# Patient Record
Sex: Male | Born: 1960 | Race: White | Hispanic: No | Marital: Single | Smoking: Never smoker
Health system: Southern US, Community
[De-identification: ages and names within clinical notes are randomized; demographics above are authoritative.]

## PROBLEM LIST (undated history)

## (undated) DIAGNOSIS — K219 Gastro-esophageal reflux disease without esophagitis: Secondary | ICD-10-CM

## (undated) DIAGNOSIS — M1990 Unspecified osteoarthritis, unspecified site: Secondary | ICD-10-CM

## (undated) HISTORY — DX: Gastro-esophageal reflux disease without esophagitis: K21.9

## (undated) HISTORY — PX: WISDOM TOOTH EXTRACTION: SHX21

## (undated) HISTORY — DX: Unspecified osteoarthritis, unspecified site: M19.90

---

## 2006-09-25 ENCOUNTER — Encounter: Payer: Self-pay | Admitting: Family Medicine

## 2009-03-24 ENCOUNTER — Encounter: Payer: Self-pay | Admitting: Family Medicine

## 2009-04-25 ENCOUNTER — Ambulatory Visit: Payer: Self-pay | Admitting: Family Medicine

## 2009-04-25 DIAGNOSIS — M109 Gout, unspecified: Secondary | ICD-10-CM | POA: Insufficient documentation

## 2009-05-26 ENCOUNTER — Telehealth: Payer: Self-pay | Admitting: Family Medicine

## 2009-11-27 ENCOUNTER — Telehealth: Payer: Self-pay | Admitting: Family Medicine

## 2009-12-15 ENCOUNTER — Ambulatory Visit: Payer: Self-pay | Admitting: Family Medicine

## 2009-12-15 LAB — CONVERTED CEMR LAB
BUN: 12 mg/dL (ref 6–23)
Basophils Relative: 0.6 % (ref 0.0–3.0)
Chloride: 106 meq/L (ref 96–112)
Cholesterol: 131 mg/dL (ref 0–200)
Eosinophils Relative: 3.6 % (ref 0.0–5.0)
GFR calc non Af Amer: 93.6 mL/min (ref >60)
HCT: 40.7 % (ref 39.0–52.0)
LDL Cholesterol: 75 mg/dL (ref 0–99)
Lymphs Abs: 1.8 10*3/uL (ref 0.7–4.0)
Monocytes Relative: 7.5 % (ref 3.0–12.0)
Platelets: 227 10*3/uL (ref 150.0–400.0)
Potassium: 4.2 meq/L (ref 3.5–5.1)
RBC: 4.75 M/uL (ref 4.22–5.81)
Sodium: 141 meq/L (ref 135–145)
Total CHOL/HDL Ratio: 4
Uric Acid, Serum: 7.4 mg/dL (ref 4.0–7.8)
VLDL: 22 mg/dL (ref 0.0–40.0)
WBC: 7 10*3/uL (ref 4.5–10.5)

## 2009-12-25 ENCOUNTER — Ambulatory Visit: Payer: Self-pay | Admitting: Family Medicine

## 2010-05-03 ENCOUNTER — Telehealth: Payer: Self-pay | Admitting: Family Medicine

## 2010-05-08 NOTE — Assessment & Plan Note (Signed)
Summary: roa//ccm   Vital Signs:  Patient profile:   50 year old male Weight:      176 pounds BMI:     24.99 O2 Sat:      97 % Temp:     96.6 degrees F oral Pulse rate:   75 / minute Pulse rhythm:   regular Resp:     16 per minute BP sitting:   120 / 82  Vitals Entered By: Lynann Beaver CMA (December 25, 2009 10:07 AM) CC: rov/gout flare in foot Is Patient Diabetic? No Pain Assessment Patient in pain? yes        History of Present Illness: Here for gout follow up.  On Allopurinol until about 2 weeks ago.  Uric acid last year 14.9 and down to  7.4 with recent labs.  Has had about 3 episodes of gout since starting allopurinol. Current flare involving L MTP joint.  Indomethacin generally works well for acute flares.   Current Medications (verified): 1)  Allopurinol 100 Mg Tabs (Allopurinol) .... One By Mouth Once Daily For 2 Weeks Then Two By Mouth Once Daily For 2 Weeks Then 3 By Mouth Once Daily  Allergies (verified): No Known Drug Allergies  Past History:  Past Medical History: Last updated: 04/25/2009 Arthritis GOUT  Family History: Last updated: 04/25/2009 Family History of Alcoholism/Addiction, grandfather Family History of Arthritis, mother Family History High cholesterol, parents Family History Hypertension, parents Heart disease, both parents  Social History: Last updated: 04/25/2009 Occupation:  [employer] [employer] Never Smoked Alcohol use-no Regular exercise-yes Married 2 children  Risk Factors: Exercise: yes (04/18/2009)  Risk Factors: Smoking Status: never (04/18/2009) PMH-FH-SH reviewed for relevance  Physical Exam  General:  Well-developed,well-nourished,in no acute distress; alert,appropriate and cooperative throughout examination Lungs:  Normal respiratory effort, chest expands symmetrically. Lungs are clear to auscultation, no crackles or wheezes. Heart:  Normal rate and regular rhythm. S1 and S2 normal without gallop, murmur,  click, rub or other extra sounds. Extremities:  L foot swelling and mild erythema MTP joint.   Impression & Recommendations:  Problem # 1:  GOUT, UNSPECIFIED (ICD-274.9) refilled indomethacin.  After acute flare subsides get back on Allopurinol and titrate to 400 mg once daily with goal uric acid < 6. repeat uric acid in 6 months after back on allopurinol.  Discussed dietary factors.  pt requests PSA with next labs. The following medications were removed from the medication list:    Indomethacin 50 Mg Caps (Indomethacin) .Marland Kitchen... 1 q8h as needed His updated medication list for this problem includes:    Allopurinol 100 Mg Tabs (Allopurinol) ..... Use as directed    Indomethacin 50 Mg Caps (Indomethacin) ..... One by mouth q 8 hours as needed  Complete Medication List: 1)  Allopurinol 100 Mg Tabs (Allopurinol) .... Use as directed 2)  Indomethacin 50 Mg Caps (Indomethacin) .... One by mouth q 8 hours as needed 3)  Oxycodone-acetaminophen 5-325 Mg Tabs (Oxycodone-acetaminophen) .Marland Kitchen.. 1-2 by mouth q 4-6 hours as needed pain  Patient Instructions: 1)  Please schedule a follow-up appointment in 6 months .  Prescriptions: OXYCODONE-ACETAMINOPHEN 5-325 MG TABS (OXYCODONE-ACETAMINOPHEN) 1-2 by mouth q 4-6 hours as needed pain  #30 x 0   Entered and Authorized by:   Evelena Peat MD   Signed by:   Evelena Peat MD on 12/25/2009   Method used:   Print then Give to Patient   RxID:   [id number] ALLOPURINOL 100 MG TABS (ALLOPURINOL) Use as directed  #120 x 11  Entered and Authorized by:   Evelena Peat MD   Signed by:   Evelena Peat MD on 12/25/2009   Method used:   Electronically to        [facility]. [phone]* (retail)       [phone] [address].       [place], [place]  [address]       Ph: [phone]       Fax: [phone]   RxID:   [id number] INDOMETHACIN 50 MG CAPS (INDOMETHACIN) one by mouth q 8 hours as needed  #30 x 3   Entered and  Authorized by:   Evelena Peat MD   Signed by:   Evelena Peat MD on 12/25/2009   Method used:   Electronically to        [facility]. [phone]* (retail)       [phone] [address].       [place], [place]  [address]       Ph: [phone]       Fax: [phone]   RxID:   [id number]

## 2010-05-08 NOTE — Progress Notes (Signed)
Summary: Rx  Phone Note Call from Patient   Caller: Patient Call For: Evelena Peat MD Summary of Call: asking for indocin for joint pain. He goes by [facility]. [facility]. Initial call taken by: Raechel Ache, RN,  May 26, 2009 2:16 PM  Follow-up for Phone Call        OK to refill Indocin 50 mg by mouth q 8 hours as needed #30 with 1 refill Follow-up by: Evelena Peat MD,  May 26, 2009 2:48 PM    New/Updated Medications: INDOMETHACIN 50 MG CAPS (INDOMETHACIN) 1 q8h as needed Prescriptions: INDOMETHACIN 50 MG CAPS (INDOMETHACIN) 1 q8h as needed  #30 x 1   Entered by:   Raechel Ache, RN   Authorized by:   Evelena Peat MD   Signed by:   Raechel Ache, RN on 05/26/2009   Method used:   Electronically to        [facility]. [phone]* (retail)       [phone]       [place], [place]  [address]       Ph: [phone]       Fax: [phone]   RxID:   [id number]

## 2010-05-08 NOTE — Assessment & Plan Note (Signed)
Summary: PT NEW TO LBF/TO ESTABLISH/PT REQ CPX COMING IN FASTING/CJR/P...   Vital Signs:  Patient profile:   50 year old male Height:      70.50 inches Weight:      173 pounds BMI:     24.56 Temp:     97.9 degrees F oral Pulse rate:   76 / minute Pulse rhythm:   regular Resp:     12 per minute BP sitting:   140 / 90  (left arm) Cuff size:   regular  Vitals Entered By: Sid Falcon LPN (April 25, 2009 10:50 AM) CC: New to establish, requesting CPX   History of Present Illness: Patient seen for establishing care. Also requesting physical. Brings lab work from urgent care done recently. Significant for uric acid 14.9 and triglycerides of 345 with LDL of 25 and HDL 42 total cholesterol 136. Patient had several labs for inflammatory marker such as ANA and RA latex which were negative.  He has history of arthralgias involving multiple joints mostly feet, ankles, knees, wrists, and hands. Asymmetric involvement. Prior history of inflammatory changes such as warmth and erythema. Presumptive diagnosis of gout. Responds well to Indocin. Does drink some excessive alcohol. Otherwise no dietary correlations. Symptoms have been progressive over recent years.  No tophaceous changes.  Patient has no other chronic medical problems. Family history reviewed signif for father's coronary disease age 83. Father also type 2 diabetes.  Social history:  he is married. 2 children. Alcohol sometimes over one and one-half cases per week. No cigarette use. Works for [employer].  Awaiting old records.  Thinks last tetanus < 10 years ago.  Past History:  Family History: Last updated: 04/25/2009 Family History of Alcoholism/Addiction, grandfather Family History of Arthritis, mother Family History High cholesterol, parents Family History Hypertension, parents Heart disease, both parents  Social History: Last updated: 04/25/2009 Occupation:  [employer] [employer] Never Smoked Alcohol use-no Regular  exercise-yes Married 2 children  Risk Factors: Exercise: yes (04/18/2009)  Risk Factors: Smoking Status: never (04/18/2009)  Past Medical History: Arthritis GOUT  Family History: Family History of Alcoholism/Addiction, grandfather Family History of Arthritis, mother Family History High cholesterol, parents Family History Hypertension, parents Heart disease, both parents  Social History: Occupation:  [employer] Never Smoked Alcohol use-no Regular exercise-yes Married 2 children  Review of Systems  The patient denies anorexia, fever, weight loss, weight gain, vision loss, decreased hearing, hoarseness, chest pain, syncope, dyspnea on exertion, peripheral edema, prolonged cough, headaches, hemoptysis, abdominal pain, melena, hematochezia, severe indigestion/heartburn, hematuria, incontinence, suspicious skin lesions, depression, enlarged lymph nodes, and testicular masses.    Physical Exam  General:  Well-developed,well-nourished,in no acute distress; alert,appropriate and cooperative throughout examination Head:  Normocephalic and atraumatic without obvious abnormalities. No apparent alopecia or balding. Eyes:  No corneal or conjunctival inflammation noted. EOMI. Perrla. Funduscopic exam benign, without hemorrhages, exudates or papilledema. Vision grossly normal. Ears:  External ear exam shows no significant lesions or deformities.  Otoscopic examination reveals clear canals, tympanic membranes are intact bilaterally without bulging, retraction, inflammation or discharge. Hearing is grossly normal bilaterally. Mouth:  Oral mucosa and oropharynx without lesions or exudates.  Teeth in good repair. Neck:  No deformities, masses, or tenderness noted. Lungs:  Normal respiratory effort, chest expands symmetrically. Lungs are clear to auscultation, no crackles or wheezes. Heart:  Normal rate and regular rhythm. S1 and S2 normal without gallop, murmur, click, rub or other  extra sounds. Abdomen:  Bowel sounds positive,abdomen soft and non-tender without masses,  organomegaly or hernias noted. Extremities:  patient has some evidence for active inflammation left hand second MCP joint. No warmth does have some mild swelling. Neurologic:  alert & oriented X3, cranial nerves II-XII intact, and gait normal.     Impression & Recommendations:  Problem # 1:  Preventive Health Care (ICD-V70.0) Reduce ETOH and establish regular exercise.  Consider fish oil daily.  Limit concentrated sweets.  Problem # 2:  GOUT, UNSPECIFIED (ICD-274.9) Decrease ETOH.  Diet discussed.  Several recent attacks and progressive inflammation.  Start Allopurinol with slow titration and recheck uric acid 2 months. Cont Indocin for acute flare. His updated medication list for this problem includes:    Allopurinol 100 Mg Tabs (Allopurinol) ..... One by mouth once daily for 2 weeks then two by mouth once daily for 2 weeks then 3 by mouth once daily  Complete Medication List: 1)  Allopurinol 100 Mg Tabs (Allopurinol) .... One by mouth once daily for 2 weeks then two by mouth once daily for 2 weeks then 3 by mouth once daily   Patient Instructions: 1)  Consider fish oil 2-3 g per day. 2)  Reduce concentrated sugar and starch intake. 3)  Start allopurinol as instructed. 4)  Consider as needed use of Indocin for any acute flareups. 5)  Gradually reduce alcohol consumption. 6)  Please schedule a follow-up appointment in 2 months.  Prescriptions: ALLOPURINOL 100 MG TABS (ALLOPURINOL) one by mouth once daily for 2 weeks then two by mouth once daily for 2 weeks then 3 by mouth once daily  #90 x 5   Entered and Authorized by:   Evelena Peat MD   Signed by:   Evelena Peat MD on 04/25/2009   Method used:   Print then Give to Patient   RxID:   [id number]

## 2010-05-08 NOTE — Progress Notes (Signed)
Summary: pt requesting labs  Phone Note Call from Patient Call back at Home Phone [phone]   Caller: Patient---live call Summary of Call: pt called and stated that he will need labs for his follow up appt. please order. Initial call taken by: Warnell Forester,  November 27, 2009 8:53 AM  Follow-up for Phone Call        Pt needs uric acid, lipid panel, CBC, and BMP. Follow-up by: Evelena Peat MD,  November 27, 2009 8:56 AM  Additional Follow-up for Phone Call Additional follow up Details #1::        pt is aware of appt. Additional Follow-up by: Warnell Forester,  November 27, 2009 9:38 AM

## 2010-05-10 NOTE — Progress Notes (Signed)
Summary: REFILL REQUEST  Phone Note Refill Request Message from:  Patient on May 03, 2010 9:23 AM  Refills Requested: Medication #1:  INDOMETHACIN 50 MG CAPS one by mouth q 8 hours as needed   Notes: [facility], [address].    Initial call taken by: Debbra Riding,  May 03, 2010 9:23 AM    Prescriptions: INDOMETHACIN 50 MG CAPS (INDOMETHACIN) one by mouth q 8 hours as needed  #30 x 3   Entered by:   Sid Falcon LPN   Authorized by:   Evelena Peat MD   Signed by:   Sid Falcon LPN on 16/01/9603   Method used:   Electronically to        [facility]. [phone]* (retail)       [phone] [address]       [place], [place]  [address]       Ph: [phone]       Fax: [phone]   RxID:   [id number]

## 2010-06-13 ENCOUNTER — Telehealth: Payer: Self-pay | Admitting: Family Medicine

## 2010-06-13 NOTE — Telephone Encounter (Signed)
Please advise 

## 2010-06-13 NOTE — Telephone Encounter (Signed)
Informed pt, explained reason and he decided not to have PSA done at this time

## 2010-06-13 NOTE — Telephone Encounter (Signed)
Pt req to get psa test added to uric acid test on 07/17/10. Pls advise.

## 2010-06-13 NOTE — Telephone Encounter (Signed)
May add PSA but insurance may not cover given age and lack of pos FH.

## 2010-06-15 ENCOUNTER — Other Ambulatory Visit: Payer: Self-pay

## 2010-06-25 ENCOUNTER — Ambulatory Visit: Payer: Self-pay | Admitting: Family Medicine

## 2010-07-17 ENCOUNTER — Other Ambulatory Visit: Payer: Self-pay

## 2010-07-20 ENCOUNTER — Encounter: Payer: Self-pay | Admitting: Family Medicine

## 2010-07-24 ENCOUNTER — Ambulatory Visit: Payer: Self-pay | Admitting: Family Medicine

## 2010-08-28 ENCOUNTER — Ambulatory Visit: Payer: Self-pay | Admitting: Family Medicine

## 2010-08-28 ENCOUNTER — Telehealth: Payer: Self-pay | Admitting: *Deleted

## 2010-08-28 ENCOUNTER — Encounter: Payer: Self-pay | Admitting: Family Medicine

## 2010-08-28 DIAGNOSIS — M1990 Unspecified osteoarthritis, unspecified site: Secondary | ICD-10-CM | POA: Insufficient documentation

## 2010-08-28 NOTE — Telephone Encounter (Signed)
No Show for 8:45 am 6 month F/U today.  I spoke with pt, "something came up with my son, I'll have to call back to reschedule".

## 2010-08-28 NOTE — Telephone Encounter (Signed)
Appt will be cancelled, no charge will occur

## 2010-08-28 NOTE — Telephone Encounter (Signed)
Don't send no show charge.

## 2011-01-06 ENCOUNTER — Other Ambulatory Visit: Payer: Self-pay | Admitting: Family Medicine

## 2011-01-07 ENCOUNTER — Telehealth: Payer: Self-pay | Admitting: Family Medicine

## 2011-01-07 MED ORDER — ALLOPURINOL 100 MG PO TABS: 100.0000 mg | ORAL_TABLET | Freq: Every day | ORAL | Status: DC

## 2011-01-07 NOTE — Telephone Encounter (Signed)
Pharmacy needs a Sig on allopurinol (ZYLOPRIM) 100 MG tablet  Please contact

## 2011-02-08 ENCOUNTER — Other Ambulatory Visit: Payer: Self-pay | Admitting: Family Medicine

## 2011-02-08 MED ORDER — INDOMETHACIN 50 MG PO CAPS: 50.0000 mg | ORAL_CAPSULE | Freq: Three times a day (TID) | ORAL | Status: DC | PRN

## 2011-02-08 NOTE — Telephone Encounter (Signed)
Pt called and is req refill of indomethacin (INDOCIN) 50 MG capsule to [facility]. Pt is out med.

## 2011-02-21 ENCOUNTER — Telehealth: Payer: Self-pay | Admitting: Family Medicine

## 2011-02-21 NOTE — Telephone Encounter (Signed)
Pt would like refill on Allopurinol called in to the [facility] on [address]. Pt is completely out. Please call it in with a few refills (per pt request) so he doesn't have to call in every month. Thanks!

## 2011-02-22 MED ORDER — ALLOPURINOL 100 MG PO TABS: 100.0000 mg | ORAL_TABLET | Freq: Every day | ORAL | Status: DC

## 2011-02-22 NOTE — Telephone Encounter (Signed)
Pt informed again he needs a return OV on VM, he is due for a 6 month F/U.  I did fill again #90 with 0 refills

## 2011-04-25 ENCOUNTER — Telehealth: Payer: Self-pay | Admitting: Family Medicine

## 2011-04-25 MED ORDER — INDOMETHACIN 50 MG PO CAPS: 50.0000 mg | ORAL_CAPSULE | Freq: Three times a day (TID) | ORAL | Status: DC | PRN

## 2011-04-25 MED ORDER — ALLOPURINOL 100 MG PO TABS: 100.0000 mg | ORAL_TABLET | Freq: Every day | ORAL | Status: DC

## 2011-04-25 NOTE — Telephone Encounter (Signed)
Pt has appt on 05-27-2011, however he needs refill Indocin 50mg  and Allopurinol 100mg - take as directed, to [facility]. Thanks.

## 2011-05-15 ENCOUNTER — Other Ambulatory Visit (INDEPENDENT_AMBULATORY_CARE_PROVIDER_SITE_OTHER): Payer: Federal, State, Local not specified - PPO

## 2011-05-15 DIAGNOSIS — Z0000 Encounter for general adult medical examination without abnormal findings: Secondary | ICD-10-CM

## 2011-05-15 LAB — BASIC METABOLIC PANEL
Calcium: 9.6 mg/dL (ref 8.4–10.5)
GFR: 94.23 mL/min (ref >60.00)
Potassium: 4.4 mEq/L (ref 3.5–5.1)
Sodium: 139 mEq/L (ref 135–145)

## 2011-05-15 LAB — POCT URINALYSIS DIPSTICK
Bilirubin, UA: NEGATIVE
Blood, UA: NEGATIVE
Glucose, UA: NEGATIVE
Leukocytes, UA: NEGATIVE
Nitrite, UA: NEGATIVE

## 2011-05-15 LAB — LIPID PANEL
Cholesterol: 150 mg/dL (ref 0–200)
HDL: 47.5 mg/dL (ref >39.00)
LDL Cholesterol: 66 mg/dL (ref 0–99)
VLDL: 36.6 mg/dL (ref 0.0–40.0)

## 2011-05-15 LAB — CBC WITH DIFFERENTIAL/PLATELET
Basophils Absolute: 0.1 10*3/uL (ref 0.0–0.1)
Eosinophils Relative: 1.9 % (ref 0.0–5.0)
HCT: 40 % (ref 39.0–52.0)
Hemoglobin: 13.6 g/dL (ref 13.0–17.0)
Lymphocytes Relative: 24 % (ref 12.0–46.0)
Lymphs Abs: 1.6 10*3/uL (ref 0.7–4.0)
Monocytes Relative: 7.9 % (ref 3.0–12.0)
Neutro Abs: 4.3 10*3/uL (ref 1.4–7.7)
WBC: 6.7 10*3/uL (ref 4.5–10.5)

## 2011-05-15 LAB — HEPATIC FUNCTION PANEL
Alkaline Phosphatase: 71 U/L (ref 39–117)
Bilirubin, Direct: 0.1 mg/dL (ref 0.0–0.3)

## 2011-05-27 ENCOUNTER — Ambulatory Visit (INDEPENDENT_AMBULATORY_CARE_PROVIDER_SITE_OTHER): Payer: Federal, State, Local not specified - PPO | Admitting: Family Medicine

## 2011-05-27 ENCOUNTER — Encounter: Payer: Self-pay | Admitting: Family Medicine

## 2011-05-27 VITALS — BP 132/90 | HR 72 | Temp 98.7°F | Resp 12 | Ht 71.0 in | Wt 186.0 lb

## 2011-05-27 DIAGNOSIS — M109 Gout, unspecified: Secondary | ICD-10-CM

## 2011-05-27 DIAGNOSIS — Z0000 Encounter for general adult medical examination without abnormal findings: Secondary | ICD-10-CM

## 2011-05-27 MED ORDER — ALLOPURINOL 100 MG PO TABS: ORAL_TABLET | ORAL | Status: DC

## 2011-05-27 MED ORDER — INDOMETHACIN 50 MG PO CAPS: 50.0000 mg | ORAL_CAPSULE | Freq: Three times a day (TID) | ORAL | Status: DC | PRN

## 2011-05-27 MED ORDER — OXYCODONE-ACETAMINOPHEN 5-325 MG PO TABS: 1.0000 | ORAL_TABLET | ORAL | Status: DC | PRN

## 2011-05-27 NOTE — Progress Notes (Signed)
  Subjective:    Patient ID: Walter [name], male    DOB: [birth year], 51 y.o.   MRN: [id number]  HPI  Patient here for complete physical. Last tetanus unknown. He defers today. He has history of gout and recently started back allopurinol currently at 200 mg daily. He is gradually increasing this. He has weight gain or past year. No consistent exercise. Drinks about 6 beers per day. He knoes he needs to scale back.  Family history is that of his father having bypass age 36. His father was a smoker.  Past Medical History  Diagnosis Date  . Arthritis   . Gout    No past surgical history on file.  reports that he has never smoked. He does not have any smokeless tobacco history on file. He reports that he does not drink alcohol. His drug history not on file. family history includes Alcohol abuse in an unspecified family member; Arthritis in his mother; Heart disease in his father and mother; Hyperlipidemia in his father and mother; and Hypertension in his father and mother. No Known Allergies    Review of Systems  Constitutional: Negative for fever, activity change, appetite change and fatigue.  HENT: Negative for ear pain, congestion and trouble swallowing.   Eyes: Negative for pain and visual disturbance.  Respiratory: Negative for cough, shortness of breath and wheezing.   Cardiovascular: Negative for chest pain and palpitations.  Gastrointestinal: Negative for nausea, vomiting, abdominal pain, diarrhea, constipation, blood in stool, abdominal distention and rectal pain.  Genitourinary: Negative for dysuria, hematuria and testicular pain.  Musculoskeletal: Positive for arthralgias. Negative for joint swelling.  Skin: Negative for rash.  Neurological: Negative for dizziness, syncope and headaches.  Hematological: Negative for adenopathy.  Psychiatric/Behavioral: Negative for confusion and dysphoric mood.       Objective:   Physical Exam  Constitutional: He is oriented to person, place,  and time. He appears well-developed and well-nourished. No distress.  HENT:  Head: Normocephalic and atraumatic.  Right Ear: External ear normal.  Left Ear: External ear normal.  Mouth/Throat: Oropharynx is clear and moist.  Eyes: Conjunctivae and EOM are normal. Pupils are equal, round, and reactive to light.  Neck: Normal range of motion. Neck supple. No thyromegaly present.  Cardiovascular: Normal rate, regular rhythm and normal heart sounds.   No murmur heard. Pulmonary/Chest: No respiratory distress. He has no wheezes. He has no rales.  Abdominal: Soft. Bowel sounds are normal. He exhibits no distension and no mass. There is no tenderness. There is no rebound and no guarding.  Musculoskeletal: He exhibits no edema.  Lymphadenopathy:    He has no cervical adenopathy.  Neurological: He is alert and oriented to person, place, and time. He displays normal reflexes. No cranial nerve deficit.  Skin: No rash noted.  Psychiatric: He has a normal mood and affect.          Assessment & Plan:  #1 health maintenance. Patient is to work on weight loss. Establish regular exercise. Scale back alcohol consumption. He has prediabetes by labs today. Reassess fasting blood sugar in 6 months #2 gout. Stable. Continue increasing allopurinol to goal 300 mg daily. Consider uric acid in 6 months. Reduce alcohol as above

## 2011-05-27 NOTE — Patient Instructions (Signed)
Work on weight loss and establishing regular exercise. Scale back alcohol use Reduce sugars and starches

## 2011-07-12 ENCOUNTER — Telehealth: Payer: Self-pay | Admitting: Family Medicine

## 2011-07-12 MED ORDER — ALLOPURINOL 300 MG PO TABS: 300.0000 mg | ORAL_TABLET | Freq: Every day | ORAL | Status: DC

## 2011-07-12 NOTE — Telephone Encounter (Signed)
Pt called and is req allopurinol (ZYLOPRIM). Pt said that Dr Caryl Never was changing dosage to a 300 mg tab, so pt will need a new script written for 90 day supply. Pt will be taking 1 pill a day. [facility] [address]. Pt is completely out of med. Pls call pt is questions.

## 2012-04-08 DIAGNOSIS — C44310 Basal cell carcinoma of skin of unspecified parts of face: Secondary | ICD-10-CM

## 2012-04-08 HISTORY — DX: Basal cell carcinoma of skin of unspecified parts of face: C44.310

## 2012-06-08 ENCOUNTER — Other Ambulatory Visit: Payer: Self-pay | Admitting: Family Medicine

## 2012-06-10 ENCOUNTER — Other Ambulatory Visit (INDEPENDENT_AMBULATORY_CARE_PROVIDER_SITE_OTHER): Payer: Federal, State, Local not specified - PPO

## 2012-06-10 DIAGNOSIS — M109 Gout, unspecified: Secondary | ICD-10-CM

## 2012-06-10 DIAGNOSIS — Z0000 Encounter for general adult medical examination without abnormal findings: Secondary | ICD-10-CM

## 2012-06-10 LAB — CBC WITH DIFFERENTIAL/PLATELET
Basophils Absolute: 0 10*3/uL (ref 0.0–0.1)
Basophils Relative: 0.6 % (ref 0.0–3.0)
Eosinophils Absolute: 0.3 10*3/uL (ref 0.0–0.7)
Hemoglobin: 13.5 g/dL (ref 13.0–17.0)
Lymphocytes Relative: 24.2 % (ref 12.0–46.0)
MCHC: 33.5 g/dL (ref 30.0–36.0)
Monocytes Relative: 6.9 % (ref 3.0–12.0)
Neutro Abs: 3.9 10*3/uL (ref 1.4–7.7)
Neutrophils Relative %: 62.7 % (ref 43.0–77.0)
RBC: 4.63 Mil/uL (ref 4.22–5.81)
RDW: 12.4 % (ref 11.5–14.6)

## 2012-06-10 LAB — POCT URINALYSIS DIPSTICK
Bilirubin, UA: NEGATIVE
Blood, UA: NEGATIVE
Glucose, UA: NEGATIVE
Ketones, UA: NEGATIVE
Spec Grav, UA: 1.02

## 2012-06-10 LAB — LIPID PANEL
Cholesterol: 148 mg/dL (ref 0–200)
HDL: 45.1 mg/dL (ref >39.00)
LDL Cholesterol: 72 mg/dL (ref 0–99)
Total CHOL/HDL Ratio: 3
Triglycerides: 155 mg/dL — ABNORMAL HIGH (ref 0.0–149.0)
VLDL: 31 mg/dL (ref 0.0–40.0)

## 2012-06-10 LAB — BASIC METABOLIC PANEL
CO2: 24 mEq/L (ref 19–32)
Calcium: 9.6 mg/dL (ref 8.4–10.5)
Creatinine, Ser: 0.8 mg/dL (ref 0.4–1.5)
GFR: 102.89 mL/min (ref >60.00)
Sodium: 139 mEq/L (ref 135–145)

## 2012-06-10 LAB — HEPATIC FUNCTION PANEL
AST: 27 U/L (ref 0–37)
Alkaline Phosphatase: 67 U/L (ref 39–117)
Bilirubin, Direct: 0.2 mg/dL (ref 0.0–0.3)

## 2012-06-22 ENCOUNTER — Ambulatory Visit (INDEPENDENT_AMBULATORY_CARE_PROVIDER_SITE_OTHER): Payer: Federal, State, Local not specified - PPO | Admitting: Family Medicine

## 2012-06-22 ENCOUNTER — Encounter: Payer: Self-pay | Admitting: Family Medicine

## 2012-06-22 VITALS — BP 130/90 | HR 72 | Temp 98.7°F | Resp 12 | Ht 71.5 in | Wt 184.0 lb

## 2012-06-22 DIAGNOSIS — K1379 Other lesions of oral mucosa: Secondary | ICD-10-CM

## 2012-06-22 DIAGNOSIS — Z0000 Encounter for general adult medical examination without abnormal findings: Secondary | ICD-10-CM

## 2012-06-22 DIAGNOSIS — K1370 Unspecified lesions of oral mucosa: Secondary | ICD-10-CM

## 2012-06-22 MED ORDER — OXYCODONE-ACETAMINOPHEN 5-325 MG PO TABS: 1.0000 | ORAL_TABLET | ORAL | Status: DC | PRN

## 2012-06-22 NOTE — Progress Notes (Signed)
  Subjective:    Patient ID: Walter [name], male    DOB: [birth year], 52 y.o.   MRN: [id number]  HPI Patient here for physical exam  History of gout with no recent flare ups on allopurinol Generally doing well. He is exercising fairly regularly. Nonsmoker.  Turns 52 this year. No history of screening colonoscopy. Last tetanus about 7-8 years ago  Patient has nodular growth left face first noted couple of months ago. Asymptomatic otherwise. No personal history of skin cancer  Past Medical History  Diagnosis Date  . Arthritis   . Gout    No past surgical history on file.  reports that he has never smoked. He does not have any smokeless tobacco history on file. He reports that he does not drink alcohol. His drug history is not on file. family history includes Alcohol abuse in an unspecified family member; Arthritis in his mother; Diabetes in his father; Heart disease (age of onset: 57) in his father; Hyperlipidemia in his father and mother; and Hypertension in his father and mother. No Known Allergies    Review of Systems  Constitutional: Negative for fever, activity change, appetite change and fatigue.  HENT: Negative for ear pain, congestion and trouble swallowing.   Eyes: Negative for pain and visual disturbance.  Respiratory: Negative for cough, shortness of breath and wheezing.   Cardiovascular: Negative for chest pain and palpitations.  Gastrointestinal: Negative for nausea, vomiting, abdominal pain, diarrhea, constipation, blood in stool, abdominal distention and rectal pain.  Genitourinary: Negative for dysuria, hematuria and testicular pain.  Musculoskeletal: Negative for joint swelling and arthralgias.  Skin: Negative for rash.  Neurological: Negative for dizziness, syncope and headaches.  Hematological: Negative for adenopathy.  Psychiatric/Behavioral: Negative for confusion and dysphoric mood.       Objective:   Physical Exam  Constitutional: He is oriented to  person, place, and time. He appears well-developed and well-nourished. No distress.  HENT:  Head: Normocephalic and atraumatic.  Right Ear: External ear normal.  Left Ear: External ear normal.  Mouth/Throat: Oropharynx is clear and moist.  Eyes: Conjunctivae and EOM are normal. Pupils are equal, round, and reactive to light.  Neck: Normal range of motion. Neck supple. No thyromegaly present.  Cardiovascular: Normal rate, regular rhythm and normal heart sounds.   No murmur heard. Pulmonary/Chest: No respiratory distress. He has no wheezes. He has no rales.  Abdominal: Soft. Bowel sounds are normal. He exhibits no distension and no mass. There is no tenderness. There is no rebound and no guarding.  Musculoskeletal: He exhibits no edema.  Lymphadenopathy:    He has no cervical adenopathy.  Neurological: He is alert and oriented to person, place, and time. He displays normal reflexes. No cranial nerve deficit.  Skin:  Patient small nodule left cheek region which is approximately 5-6 mm in length with slightly umbilicated center  Psychiatric: He has a normal mood and affect.          Assessment & Plan:  Complete physical. Schedule screening colonoscopy after age 85. Continue regular exercise habits. Tetanus booster 1-2 years. Set up dermatology referral for nodular skin lesion left face. Rule out basal cell carcinoma

## 2012-07-07 ENCOUNTER — Ambulatory Visit (INDEPENDENT_AMBULATORY_CARE_PROVIDER_SITE_OTHER): Payer: Federal, State, Local not specified - PPO | Admitting: Internal Medicine

## 2012-07-07 VITALS — BP 132/90 | HR 86 | Temp 98.4°F | Resp 18 | Ht 70.5 in | Wt 180.0 lb

## 2012-07-07 DIAGNOSIS — R21 Rash and other nonspecific skin eruption: Secondary | ICD-10-CM

## 2012-07-07 DIAGNOSIS — L299 Pruritus, unspecified: Secondary | ICD-10-CM

## 2012-07-07 DIAGNOSIS — L270 Generalized skin eruption due to drugs and medicaments taken internally: Secondary | ICD-10-CM

## 2012-07-07 MED ORDER — CETIRIZINE HCL 10 MG PO TABS: 10.0000 mg | ORAL_TABLET | Freq: Every day | ORAL | Status: DC

## 2012-07-07 MED ORDER — PREDNISONE 10 MG PO TABS: ORAL_TABLET | ORAL | Status: DC

## 2012-07-07 MED ORDER — METHYLPREDNISOLONE ACETATE 80 MG/ML IJ SUSP
120.0000 mg | Freq: Once | INTRAMUSCULAR | Status: AC
  Administered 2012-07-07: 120 mg via INTRAMUSCULAR

## 2012-07-07 NOTE — Progress Notes (Signed)
  Subjective:    Patient ID: Walter [name], male    DOB: [birth year], 52 y.o.   MRN: [id number]  HPI Covered in florid maculopapular erythematous pruitic rash of sudden onset. On 2 meds allopurinol and indocin but also supplements. No resp distress or angioedema.   Review of Systems Gout    Objective:   Physical Exam  Vitals reviewed. Constitutional: He is oriented to person, place, and time. He appears well-developed and well-nourished.  HENT:  Right Ear: External ear normal.  Left Ear: External ear normal.  Nose: Nose normal.  Mouth/Throat: Oropharynx is clear and moist.  Eyes: EOM are normal. No scleral icterus.  Neck: Neck supple.  Cardiovascular: Normal rate, regular rhythm and normal heart sounds.   Pulmonary/Chest: Effort normal and breath sounds normal.  Musculoskeletal: Normal range of motion.  Lymphadenopathy:    He has no cervical adenopathy.  Neurological: He is alert and oriented to person, place, and time. He exhibits normal muscle tone. Coordination normal.  Skin: Skin is intact. Rash noted. Rash is maculopapular. There is erythema.             Assessment & Plan:  Depomedrol 120mg /Benedryl po/Zyrtec po DC allopurinol and indocin till well/work with Dr. Caryl Never to add back prn

## 2012-07-07 NOTE — Patient Instructions (Signed)
Drug Rash  Skin reactions can be caused by several different drugs. Allergy to the medicine can cause itching, hives, and other rashes. Sun exposure causes a red rash with some medicines. Mononucleosis virus can cause a similar red rash when you are taking antibiotics. Sometimes, the rash may be accompanied by pain. The drug rash may happen with new drugs or with medicines that you have been taking for a while. The rash cannot be spread from person to person. In most cases, the symptoms of a drug rash are gone within a few days of stopping the medicine.  Your rash, including hives (urticaria), is most likely from the following medicines:   Antibiotics or antimicrobials.   Anticonvulsants or seizure medicines.   Antihypertensives or blood pressure medicines.   Antimalarials.   Antidepressants or depression medicines.   Antianxiety drugs.   Diuretics or water pills.   Nonsteroidal anti-inflammatory drugs.   Simvastatin.   Lithium.   Omeprazole.   Allopurinol.   Pseudoephedrine.   Amiodarone.   Packed red blood cells, when you get a blood transfusion.   Contrast media, such as when getting an imaging test (CT or CAT scan).  This drug list is not all inclusive, but drug rashes have been reported with all the medicines listed above.Your caregiver will tell you which medicines to avoid.  If you react to a medicine, a similar or worse reaction can occur the next time you take it. If you need to stop taking an antibiotic because of a drug rash, an alternative antibiotic may be needed to get rid of your infection. Antihistamine or cortisone drugs may be prescribed to help relieve your symptoms. Stay out of the sun until the rash is completely gone.   Be sure to let your caregiver know about your drug reaction. Do not take this medicine in the future. Call your caregiver if your drug rash does not improve within 3 to 4 days.  SEEK IMMEDIATE MEDICAL CARE IF:    You develop breathing problems, swelling in the  throat, or wheezing.   You have weakness, fainting, fever, and muscle or joint pains.   You develop blisters or peeling of skin, especially around the mouth.  Document Released: 05/02/2004 Document Revised: 06/17/2011 Document Reviewed: 02/11/2008  ExitCare Patient Information 2013 [employer].

## 2012-08-06 ENCOUNTER — Ambulatory Visit: Payer: Federal, State, Local not specified - PPO

## 2012-08-06 ENCOUNTER — Ambulatory Visit (INDEPENDENT_AMBULATORY_CARE_PROVIDER_SITE_OTHER): Payer: Federal, State, Local not specified - PPO | Admitting: Internal Medicine

## 2012-08-06 VITALS — BP 137/91 | HR 80 | Temp 98.2°F | Resp 18 | Ht 71.0 in | Wt 174.0 lb

## 2012-08-06 DIAGNOSIS — R109 Unspecified abdominal pain: Secondary | ICD-10-CM

## 2012-08-06 DIAGNOSIS — R0781 Pleurodynia: Secondary | ICD-10-CM

## 2012-08-06 DIAGNOSIS — S20212A Contusion of left front wall of thorax, initial encounter: Secondary | ICD-10-CM

## 2012-08-06 DIAGNOSIS — R079 Chest pain, unspecified: Secondary | ICD-10-CM

## 2012-08-06 DIAGNOSIS — S20219A Contusion of unspecified front wall of thorax, initial encounter: Secondary | ICD-10-CM

## 2012-08-06 LAB — POCT UA - MICROSCOPIC ONLY
Bacteria, U Microscopic: NEGATIVE
Casts, Ur, LPF, POC: NEGATIVE
Mucus, UA: NEGATIVE
RBC, urine, microscopic: NEGATIVE
WBC, Ur, HPF, POC: NEGATIVE

## 2012-08-06 LAB — POCT URINALYSIS DIPSTICK
Bilirubin, UA: NEGATIVE
Glucose, UA: NEGATIVE
Ketones, UA: NEGATIVE
Leukocytes, UA: NEGATIVE
Nitrite, UA: NEGATIVE
pH, UA: 5.5

## 2012-08-06 MED ORDER — OXYCODONE-ACETAMINOPHEN 5-325 MG PO TABS: 1.0000 | ORAL_TABLET | ORAL | Status: DC | PRN

## 2012-08-06 NOTE — Progress Notes (Signed)
  Subjective:    Patient ID: Walter [name], male    DOB: [birth year], 52 y.o.   MRN: [id number]  HPI    Review of Systems     Objective:   Physical Exam  [facility] reading (PRIMARY) by  Dr.Guest no rib fx seen        Assessment & Plan:

## 2012-08-06 NOTE — Patient Instructions (Addendum)
Rib Contusion  A rib contusion (bruise) can occur by a blow to the chest or by a fall against a hard object. Usually these will be much better in a couple weeks. If X-rays were taken today and there are no broken bones (fractures), the diagnosis of bruising is made. However, broken ribs may not show up for several days, or may be discovered later on a routine X-ray when signs of healing show up. If this happens to you, it does not mean that something was missed on the X-ray, but simply that it did not show up on the first X-rays. Earlier diagnosis will not usually change the treatment.  HOME CARE INSTRUCTIONS   · Avoid strenuous activity. Be careful during activities and avoid bumping the injured ribs. Activities that pull on the injured ribs and cause pain should be avoided, if possible.  · For the first day or two, an ice pack used every 20 minutes while awake may be helpful. Put ice in a plastic bag and put a towel between the bag and the skin.  · Eat a normal, well-balanced diet. Drink plenty of fluids to avoid constipation.  · Take deep breaths several times a day to keep lungs free of infection. Try to cough several times a day. Splint the injured area with a pillow while coughing to ease pain. Coughing can help prevent pneumonia.  · Wear a rib belt or binder only if told to do so by your caregiver. If you are wearing a rib belt or binder, you must do the breathing exercises as directed by your caregiver. If not used properly, rib belts or binders restrict breathing which can lead to pneumonia.  · Only take over-the-counter or prescription medicines for pain, discomfort, or fever as directed by your caregiver.  SEEK MEDICAL CARE IF:   · You or your child has an oral temperature above 102° F (38.9° C).  · Your baby is older than 3 months with a rectal temperature of 100.5° F (38.1° C) or higher for more than 1 day.  · You develop a cough, with thick or bloody sputum.  SEEK IMMEDIATE MEDICAL CARE IF:   · You  have difficulty breathing.  · You feel sick to your stomach (nausea), have vomiting or belly (abdominal) pain.  · You have worsening pain, not controlled with medications, or there is a change in the location of the pain.  · You develop sweating or radiation of the pain into the arms, jaw or shoulders, or become light headed or faint.  · You or your child has an oral temperature above 102° F (38.9° C), not controlled by medicine.  · Your or your baby is older than 3 months with a rectal temperature of 102° F (38.9° C) or higher.  · Your baby is 3 months old or younger with a rectal temperature of 100.4° F (38° C) or higher.  MAKE SURE YOU:   · Understand these instructions.  · Will watch your condition.  · Will get help right away if you are not doing well or get worse.  Document Released: 12/18/2000 Document Revised: 06/17/2011 Document Reviewed: 11/11/2007  Exit[employer]® Patient Information ©2013 [employer].

## 2012-08-06 NOTE — Progress Notes (Signed)
  Subjective:    Patient ID: Walter [name], male    DOB: 1960-11-19, 52 y.o.   MRN: [id number]  HPI    Review of Systems     Objective:   Physical Exam        Assessment & Plan:

## 2012-08-06 NOTE — Progress Notes (Signed)
  Subjective:    Patient ID: Walter [name], male    DOB: [birth year], 52 y.o.   MRN: [id number]  HPIPatient fell down stairs injuring the left ribs and thigh.   Has not been able to work at [employer] job due to injury. Fell at home. Painfull yo breath deep and lift. No hemoptysis, no dark urine , left flank minimal tender.   Review of Systems gout    Objective:   Physical Exam  Vitals reviewed. Constitutional: He is oriented to person, place, and time. He appears well-developed and well-nourished.  Cardiovascular: Normal rate and normal heart sounds.   Pulmonary/Chest: Effort normal and breath sounds normal. Not tachypneic. No respiratory distress. He has no decreased breath sounds. He has no wheezes. He has no rales.   He exhibits tenderness.  Pain x  Abdominal: There is tenderness.  Musculoskeletal: He exhibits tenderness.  Neurological: He is alert and oriented to person, place, and time. He exhibits normal muscle tone. Coordination normal.  Skin: Skin is warm and dry. Abrasion, bruising and ecchymosis noted. There is erythema.     Psychiatric: He has a normal mood and affect.   [facility] reading (PRIMARY) by  Dr.Haniya Fern         Assessment & Plan:  No lifting Ice/ rest/Percoset

## 2012-08-06 NOTE — Progress Notes (Signed)
  Subjective:    Patient ID: Walter [name], male    DOB: Apr 29, 1960, 52 y.o.   MRN: [id number]  HPI    Review of Systems     Objective:   Physical Exam  Results for orders placed in visit on 08/06/12  POCT URINALYSIS DIPSTICK      Result Value Range   Color, UA yellow     Clarity, UA clear     Glucose, UA neg     Bilirubin, UA neg     Ketones, UA neg     Spec Grav, UA 1.015     Blood, UA neg     pH, UA 5.5     Protein, UA neg     Urobilinogen, UA 0.2     Nitrite, UA neg     Leukocytes, UA Negative    POCT UA - MICROSCOPIC ONLY      Result Value Range   WBC, Ur, HPF, POC neg     RBC, urine, microscopic neg     Bacteria, U Microscopic neg     Mucus, UA neg     Epithelial cells, urine per micros neg     Crystals, Ur, HPF, POC neg     Casts, Ur, LPF, POC neg     Yeast, UA neg           Assessment & Plan:

## 2013-04-08 HISTORY — PX: UPPER GASTROINTESTINAL ENDOSCOPY: SHX188

## 2013-04-08 HISTORY — PX: POLYPECTOMY: SHX149

## 2013-04-08 HISTORY — PX: COLONOSCOPY: SHX174

## 2013-04-27 ENCOUNTER — Telehealth: Payer: Self-pay | Admitting: Family Medicine

## 2013-04-27 NOTE — Telephone Encounter (Signed)
Appointment made

## 2013-04-27 NOTE — Telephone Encounter (Signed)
Patient Information:  Caller Name: Bray  Phone: [phone]  Patient: Walter [name], Walter [name]  Gender: Male  DOB: 1960-10-09  Age: 53 Years  PCP: Carolann Littler (Family Practice)  Office Follow Up:  Does the office need to follow up with this patient?: Yes  Instructions For The Office: Has see today in office disposition- no appointments left in Epic- requesting Zpak   Symptoms  Reason For Call & Symptoms: Walter [name] states he has had possible influenza symptoms since 04/23/13. Headache , body aches, cough, cough causing burning in chest. Intermittent fever 99. Requesting a ZPak be called in to [facility]. Advised antibiotics not normally called in unless patient is seen in office. Per cough protocol has see today in office due to "slight" wheezing. No appointments available in Epic. Please call [facility] at [phone]  Reviewed Health History In EMR: Yes  Reviewed Medications In EMR: Yes  Reviewed Allergies In EMR: Yes  Reviewed Surgeries / Procedures: Yes  Date of Onset of Symptoms: 04/23/2013  Treatments Tried: Dayquil, Nyquil, Theraflu  Treatments Tried Worked: No  Guideline(s) Used:  Cough  Disposition Per Guideline:   Go to Office Now  Reason For Disposition Reached:   Wheezing is present  Advice Given:  N/A  Patient Refused Recommendation:  Patient Requests Prescription  Requesting Zpak be called in to pharmacy

## 2013-04-28 ENCOUNTER — Ambulatory Visit (INDEPENDENT_AMBULATORY_CARE_PROVIDER_SITE_OTHER): Payer: Federal, State, Local not specified - PPO | Admitting: Family Medicine

## 2013-04-28 ENCOUNTER — Encounter: Payer: Self-pay | Admitting: Family Medicine

## 2013-04-28 VITALS — BP 134/82 | HR 75 | Temp 98.8°F | Wt 185.0 lb

## 2013-04-28 DIAGNOSIS — J209 Acute bronchitis, unspecified: Secondary | ICD-10-CM

## 2013-04-28 MED ORDER — AZITHROMYCIN 250 MG PO TABS: ORAL_TABLET | ORAL | Status: AC

## 2013-04-28 NOTE — Patient Instructions (Signed)
Acute Bronchitis Bronchitis is inflammation of the airways that extend from the windpipe into the lungs (bronchi). The inflammation often causes mucus to develop. This leads to a cough, which is the most common symptom of bronchitis.  In acute bronchitis, the condition usually develops suddenly and goes away over time, usually in a couple weeks. Smoking, allergies, and asthma can make bronchitis worse. Repeated episodes of bronchitis may cause further lung problems.  CAUSES Acute bronchitis is most often caused by the same virus that causes a cold. The virus can spread from person to person (contagious).  SIGNS AND SYMPTOMS   Cough.   Fever.   Coughing up mucus.   Body aches.   Chest congestion.   Chills.   Shortness of breath.   Sore throat.  DIAGNOSIS  Acute bronchitis is usually diagnosed through a physical exam. Tests, such as chest X-rays, are sometimes done to rule out other conditions.  TREATMENT  Acute bronchitis usually goes away in a couple weeks. Often times, no medical treatment is necessary. Medicines are sometimes given for relief of fever or cough. Antibiotics are usually not needed but may be prescribed in certain situations. In some cases, an inhaler may be recommended to help reduce shortness of breath and control the cough. A cool mist vaporizer may also be used to help thin bronchial secretions and make it easier to clear the chest.  HOME CARE INSTRUCTIONS  Get plenty of rest.   Drink enough fluids to keep your urine clear or pale yellow (unless you have a medical condition that requires fluid restriction). Increasing fluids may help thin your secretions and will prevent dehydration.   Only take over-the-counter or prescription medicines as directed by your health care provider.   Avoid smoking and secondhand smoke. Exposure to cigarette smoke or irritating chemicals will make bronchitis worse. If you are a smoker, consider using nicotine gum or skin  patches to help control withdrawal symptoms. Quitting smoking will help your lungs heal faster.   Reduce the chances of another bout of acute bronchitis by washing your hands frequently, avoiding people with cold symptoms, and trying not to touch your hands to your mouth, nose, or eyes.   Follow up with your health care provider as directed.  SEEK MEDICAL CARE IF: Your symptoms do not improve after 1 week of treatment.  SEEK IMMEDIATE MEDICAL CARE IF:  You develop an increased fever or chills.   You have chest pain.   You have severe shortness of breath.  You have bloody sputum.   You develop dehydration.  You develop fainting.  You develop repeated vomiting.  You develop a severe headache. MAKE SURE YOU:   Understand these instructions.  Will watch your condition.  Will get help right away if you are not doing well or get worse. Document Released: 05/02/2004 Document Revised: 11/25/2012 Document Reviewed: 09/15/2012 Exit[employer] Patient Information 2014 [employer].  

## 2013-04-28 NOTE — Progress Notes (Signed)
Pre visit review using our clinic review tool, if applicable. No additional management support is needed unless otherwise documented below in the visit note. 

## 2013-04-28 NOTE — Progress Notes (Signed)
   Subjective:    Patient ID: Walter [name], male    DOB: 09-17-1960, 53 y.o.   MRN: [id number]  HPI Patient seen for acute illness. Onset last week of cough, body aches, headache. His main complaint now is chest congestion and productive cough. His body aches have improved. Denies any nausea or vomiting. No pleuritic pain. No dyspnea. Nonsmoker.  Past Medical History  Diagnosis Date  . Arthritis   . Gout    No past surgical history on file.  reports that he has never smoked. He does not have any smokeless tobacco history on file. He reports that he does not drink alcohol. His drug history is not on file. family history includes Alcohol abuse in an other family member; Arthritis in his mother; Diabetes in his father; Heart disease (age of onset: 64) in his father; Hyperlipidemia in his father and mother; Hypertension in his father and mother. Allergies  Allergen Reactions  . Indocin [Indomethacin] Rash      Review of Systems  Constitutional: Positive for fatigue. Negative for fever and chills.  HENT: Positive for congestion.   Respiratory: Positive for cough. Negative for shortness of breath and wheezing.   Cardiovascular: Negative for chest pain.       Objective:   Physical Exam  Constitutional: He appears well-developed and well-nourished.  HENT:  Right Ear: External ear normal.  Left Ear: External ear normal.  Mouth/Throat: Oropharynx is clear and moist.  Neck: Neck supple.  Cardiovascular: Normal rate and regular rhythm.   Pulmonary/Chest: Effort normal and breath sounds normal. No respiratory distress. He has no wheezes. He has no rales.  Lymphadenopathy:    He has no cervical adenopathy.          Assessment & Plan:  Acute bronchitis. Explained these are usually viral. Nonfocal exam. Observation for now. Start Zithromax if he has any fever or worsening symptoms.

## 2013-07-03 ENCOUNTER — Other Ambulatory Visit: Payer: Self-pay | Admitting: Family Medicine

## 2013-07-06 ENCOUNTER — Other Ambulatory Visit (INDEPENDENT_AMBULATORY_CARE_PROVIDER_SITE_OTHER): Payer: Federal, State, Local not specified - PPO

## 2013-07-06 DIAGNOSIS — M109 Gout, unspecified: Secondary | ICD-10-CM

## 2013-07-06 DIAGNOSIS — Z0000 Encounter for general adult medical examination without abnormal findings: Secondary | ICD-10-CM

## 2013-07-06 LAB — URIC ACID: Uric Acid, Serum: 13 mg/dL — ABNORMAL HIGH (ref 4.0–7.8)

## 2013-07-06 LAB — CBC WITH DIFFERENTIAL/PLATELET
Basophils Absolute: 0 10*3/uL (ref 0.0–0.1)
Basophils Relative: 0.5 % (ref 0.0–3.0)
EOS ABS: 0.1 10*3/uL (ref 0.0–0.7)
Eosinophils Relative: 1.8 % (ref 0.0–5.0)
HCT: 41.8 % (ref 39.0–52.0)
Hemoglobin: 14 g/dL (ref 13.0–17.0)
Lymphocytes Relative: 21.6 % (ref 12.0–46.0)
Lymphs Abs: 1.4 10*3/uL (ref 0.7–4.0)
MCHC: 33.4 g/dL (ref 30.0–36.0)
MCV: 86.2 fl (ref 78.0–100.0)
MONO ABS: 0.7 10*3/uL (ref 0.1–1.0)
Monocytes Relative: 11.3 % (ref 3.0–12.0)
NEUTROS PCT: 64.8 % (ref 43.0–77.0)
Neutro Abs: 4.3 10*3/uL (ref 1.4–7.7)
PLATELETS: 207 10*3/uL (ref 150.0–400.0)
RBC: 4.85 Mil/uL (ref 4.22–5.81)
RDW: 12.7 % (ref 11.5–14.6)
WBC: 6.6 10*3/uL (ref 4.5–10.5)

## 2013-07-06 LAB — BASIC METABOLIC PANEL
BUN: 11 mg/dL (ref 6–23)
CHLORIDE: 102 meq/L (ref 96–112)
CO2: 26 meq/L (ref 19–32)
CREATININE: 1.1 mg/dL (ref 0.4–1.5)
Calcium: 9.6 mg/dL (ref 8.4–10.5)
GFR: 75.84 mL/min (ref >60.00)
GLUCOSE: 93 mg/dL (ref 70–99)
Potassium: 4.6 mEq/L (ref 3.5–5.1)
Sodium: 137 mEq/L (ref 135–145)

## 2013-07-06 LAB — POCT URINALYSIS DIPSTICK
BILIRUBIN UA: NEGATIVE
Blood, UA: NEGATIVE
GLUCOSE UA: NEGATIVE
Ketones, UA: NEGATIVE
LEUKOCYTES UA: NEGATIVE
NITRITE UA: NEGATIVE
PH UA: 6
Protein, UA: NEGATIVE
Spec Grav, UA: 1.02
UROBILINOGEN UA: 0.2

## 2013-07-06 LAB — TSH: TSH: 1.84 u[IU]/mL (ref 0.35–5.50)

## 2013-07-06 LAB — LIPID PANEL
CHOLESTEROL: 164 mg/dL (ref 0–200)
HDL: 53.6 mg/dL (ref >39.00)
LDL Cholesterol: 83 mg/dL (ref 0–99)
TRIGLYCERIDES: 139 mg/dL (ref 0.0–149.0)
Total CHOL/HDL Ratio: 3
VLDL: 27.8 mg/dL (ref 0.0–40.0)

## 2013-07-06 LAB — HEPATIC FUNCTION PANEL
ALT: 34 U/L (ref 0–53)
AST: 25 U/L (ref 0–37)
Albumin: 4.6 g/dL (ref 3.5–5.2)
Alkaline Phosphatase: 66 U/L (ref 39–117)
BILIRUBIN DIRECT: 0.1 mg/dL (ref 0.0–0.3)
BILIRUBIN TOTAL: 0.8 mg/dL (ref 0.3–1.2)
Total Protein: 7 g/dL (ref 6.0–8.3)

## 2013-07-06 LAB — PSA: PSA: 0.39 ng/mL (ref 0.10–4.00)

## 2013-07-26 ENCOUNTER — Encounter: Payer: Self-pay | Admitting: Family Medicine

## 2013-07-26 ENCOUNTER — Ambulatory Visit (INDEPENDENT_AMBULATORY_CARE_PROVIDER_SITE_OTHER): Payer: Federal, State, Local not specified - PPO | Admitting: Family Medicine

## 2013-07-26 VITALS — BP 130/86 | HR 88 | Temp 98.6°F | Ht 70.5 in | Wt 190.0 lb

## 2013-07-26 DIAGNOSIS — R109 Unspecified abdominal pain: Secondary | ICD-10-CM

## 2013-07-26 DIAGNOSIS — R1310 Dysphagia, unspecified: Secondary | ICD-10-CM

## 2013-07-26 DIAGNOSIS — Z0000 Encounter for general adult medical examination without abnormal findings: Secondary | ICD-10-CM

## 2013-07-26 DIAGNOSIS — M109 Gout, unspecified: Secondary | ICD-10-CM

## 2013-07-26 MED ORDER — SILDENAFIL CITRATE 100 MG PO TABS: 100.0000 mg | ORAL_TABLET | Freq: Every day | ORAL | Status: DC | PRN

## 2013-07-26 MED ORDER — OXYCODONE-ACETAMINOPHEN 5-325 MG PO TABS: 1.0000 | ORAL_TABLET | ORAL | Status: DC | PRN

## 2013-07-26 NOTE — Patient Instructions (Signed)
Dysphagia  Swallowing problems (dysphagia) occur when solids and liquids seem to stick in your throat on the way down to your stomach, or the food takes longer to get to the stomach. Other symptoms include regurgitating food, noises coming from the throat, chest discomfort with swallowing, and a feeling of fullness or the feeling of something being stuck in your throat when swallowing. When blockage in your throat is complete it may be associated with drooling.  CAUSES   Problems with swallowing may occur because of problems with the muscles. The food cannot be propelled in the usual manner into your stomach. You may have ulcers, scar tissue, or inflammation in the tube down which food travels from your mouth to your stomach (esophagus), which blocks food from passing normally into the stomach. Causes of inflammation include:  · Acid reflux from your stomach into your esophagus.  · Infection.  · Radiation treatment for cancer.  · Medicines taken without enough fluids to wash them down into your stomach.  You may have nerve problems that prevent signals from being sent to the muscles of your esophagus to contract and move your food down to your stomach. Globus pharyngeus is a relatively common problem in which there is a sense of an obstruction or difficulty in swallowing, without any physical abnormalities of the swallowing passages being found. This problem usually improves over time with reassurance and testing to rule out other causes.  DIAGNOSIS  Dysphagia can be diagnosed and its cause can be determined by tests in which you swallow a white substance that helps illuminate the inside of your throat (contrast medium) while X-rays are taken. Sometimes a flexible telescope that is inserted down your throat (endoscopy) to look at your esophagus and stomach is used.  TREATMENT   · If the dysphagia is caused by acid reflux or infection, medicines may be used.  · If the dysphagia is caused by problems with your  swallowing muscles, swallowing therapy may be used to help you strengthen your swallowing muscles.  · If the dysphagia is caused by a blockage or mass, procedures to remove the blockage may be done.  HOME CARE INSTRUCTIONS  · Try to eat soft food that is easier to swallow and check your weight on a daily basis to be sure that it is not decreasing.  · Be sure to drink liquids when sitting upright (not lying down).  SEEK MEDICAL CARE IF:  · You are losing weight because you are unable to swallow.  · You are coughing when you drink liquids (aspiration).  · You are coughing up partially digested food.  SEEK IMMEDIATE MEDICAL CARE IF:  · You are unable to swallow your own saliva .  · You are having shortness of breath or a fever, or both.  · You have a hoarse voice along with difficulty swallowing.  MAKE SURE YOU:  · Understand these instructions.  · Will watch your condition.  · Will get help right away if you are not doing well or get worse.  Document Released: 03/22/2000 Document Revised: 11/25/2012 Document Reviewed: 09/11/2012  ExitCare® Patient Information ©2014 [employer].

## 2013-07-26 NOTE — Progress Notes (Signed)
Pre visit review using our clinic review tool, if applicable. No additional management support is needed unless otherwise documented below in the visit note. 

## 2013-07-26 NOTE — Progress Notes (Signed)
Subjective:    Patient ID: Walter [name], male    DOB: 1960/09/01, 53 y.o.   MRN: [id number]  HPI Patient seen for complete physical. He has history of gout but basically took himself off medication within the past year. He has not had any acute flareups he estimates in over 2 years. She's had previous tophaceous changes involving the right hand. He was on allopurinol but developed a skin rash and was never confirmed this was related. He never went back on this. His last tetanus is estimated at less than 10 years but exact date unknown. He's not had previous colonoscopy. Nonsmoker. Exercises some with calisthenics  Patient describes over the past 3 years intermittent dysphagia. This tends to occur early morning and sometimes takes a couple minutes for food to pass down. He thinks has been slowly progressive. Denies any appetite or weight changes. His symptoms are very intermittent. He's not having active GERD symptoms  Past Medical History  Diagnosis Date  . Arthritis   . Gout    No past surgical history on file.  reports that he has never smoked. He does not have any smokeless tobacco history on file. He reports that he does not drink alcohol. His drug history is not on file. family history includes Alcohol abuse in an other family member; Arthritis in his mother; Diabetes in his father; Heart disease (age of onset: 66) in his father; Hyperlipidemia in his father and mother; Hypertension in his father and mother. Allergies  Allergen Reactions  . Indocin [Indomethacin] Rash      Review of Systems  Constitutional: Negative for fever, activity change, appetite change and fatigue.  HENT: Negative for congestion, ear pain and trouble swallowing.   Eyes: Negative for pain and visual disturbance.  Respiratory: Negative for cough, shortness of breath and wheezing.   Cardiovascular: Negative for chest pain and palpitations.  Gastrointestinal: Negative for nausea, vomiting, abdominal pain,  diarrhea, constipation, blood in stool, abdominal distention and rectal pain.  Genitourinary: Negative for dysuria, hematuria and testicular pain.  Musculoskeletal: Negative for arthralgias and joint swelling.  Skin: Negative for rash.  Neurological: Negative for dizziness, syncope and headaches.  Hematological: Negative for adenopathy.  Psychiatric/Behavioral: Negative for confusion and dysphoric mood.       Objective:   Physical Exam  Constitutional: He is oriented to person, place, and time. He appears well-developed and well-nourished. No distress.  HENT:  Head: Normocephalic and atraumatic.  Right Ear: External ear normal.  Left Ear: External ear normal.  Mouth/Throat: Oropharynx is clear and moist.  Eyes: Conjunctivae and EOM are normal. Pupils are equal, round, and reactive to light.  Neck: Normal range of motion. Neck supple. No thyromegaly present.  Cardiovascular: Normal rate, regular rhythm and normal heart sounds.   No murmur heard. Pulmonary/Chest: No respiratory distress. He has no wheezes. He has no rales.  Abdominal: Soft. Bowel sounds are normal. He exhibits no distension and no mass. There is no tenderness. There is no rebound and no guarding.  Musculoskeletal: He exhibits no edema.  Lymphadenopathy:    He has no cervical adenopathy.  Neurological: He is alert and oriented to person, place, and time. He displays normal reflexes. No cranial nerve deficit.  Skin: No rash noted.  Psychiatric: He has a normal mood and affect.          Assessment & Plan:  Complete physical. Confirm date of last tetanus. Set up screening colonoscopy. Set up GI referral for consideration of upper endoscopy as  well given his somewhat progressive dysphagia symptoms over the past few years. He does not have any red flags such as appetite or weight changes.  Regarding his gout, we discussed going back on medication such as allopurinol or Uloric. He's not interested at this time. Dietary  factors discussed He does consume about 4 beers per day occasionally and is strongly encouraged to reduce alcohol consumption

## 2013-08-02 ENCOUNTER — Encounter: Payer: Self-pay | Admitting: Gastroenterology

## 2013-09-22 ENCOUNTER — Encounter: Payer: Self-pay | Admitting: Gastroenterology

## 2013-09-22 ENCOUNTER — Ambulatory Visit (INDEPENDENT_AMBULATORY_CARE_PROVIDER_SITE_OTHER): Payer: Federal, State, Local not specified - PPO | Admitting: Gastroenterology

## 2013-09-22 VITALS — BP 132/84 | HR 88 | Ht 70.5 in | Wt 181.0 lb

## 2013-09-22 DIAGNOSIS — R1314 Dysphagia, pharyngoesophageal phase: Secondary | ICD-10-CM

## 2013-09-22 DIAGNOSIS — Z1211 Encounter for screening for malignant neoplasm of colon: Secondary | ICD-10-CM

## 2013-09-22 MED ORDER — MOVIPREP 100 G PO SOLR: 1.0000 | Freq: Once | ORAL | Status: DC

## 2013-09-22 NOTE — Progress Notes (Signed)
HPI: This is a    very pleasant 53 year old man whom I am meeting for the first time today.  No colon caner in family.  No overt GI symptoms.   Intermittent, 2 years of intermittent dysphagia.  Solid food dysphagia.  Will occur pretty randomly.  Will swallow water and can regurgitate.  Overall his weight is down, intentially (5-6 pounds).  Walking 4-5 miles per day.  Sometimes pyrosis.  intermittent    Review of systems: Pertinent positive and negative review of systems were noted in the above HPI section. Complete review of systems was performed and was otherwise normal.    Past Medical History  Diagnosis Date  . Arthritis   . Gout     History reviewed. No pertinent past surgical history.  Current Outpatient Prescriptions  Medication Sig Dispense Refill  . cetirizine (ZYRTEC) 10 MG tablet Take 1 tablet (10 mg total) by mouth daily.  30 tablet  11  . indomethacin (INDOCIN) 50 MG capsule take 1 capsule by mouth every 8 hours if needed  90 capsule  2  . oxyCODONE-acetaminophen (PERCOCET/ROXICET) 5-325 MG per tablet Take 1 tablet by mouth every 4 (four) hours as needed.  30 tablet  0  . sildenafil (VIAGRA) 100 MG tablet Take 1 tablet (100 mg total) by mouth daily as needed for erectile dysfunction.  10 tablet  11   No current facility-administered medications for this visit.    Allergies as of 09/22/2013 - Review Complete 09/22/2013  Allergen Reaction Noted  . Indocin [indomethacin] Rash 08/06/2012    Family History  Problem Relation Age of Onset  . Arthritis Mother   . Hyperlipidemia Mother   . Hypertension Mother   . Hyperlipidemia Father   . Hypertension Father   . Heart disease Father 22    CAD  . Diabetes Father     type 2  . Alcohol abuse      grandfather  . Colon cancer Neg Hx   . Stomach cancer Neg Hx   . Esophageal cancer Neg Hx     History   Social History  . Marital Status: Single    Spouse Name: N/A    Number of Children: 2  . Years of  Education: N/A   Occupational History  .     Social History Main Topics  . Smoking status: Never Smoker   . Smokeless tobacco: Never Used  . Alcohol Use: Yes     Comment: 3 drinks daily   . Drug Use: No  . Sexual Activity: Not on file   Other Topics Concern  . Not on file   Social History Narrative   Daily caffeine       Physical Exam: BP 132/84  Pulse 88  Ht 5' 10.5" (1.791 m)  Wt 181 lb (82.101 kg)  BMI 25.60 kg/m2 Constitutional: generally well-appearing Psychiatric: alert and oriented x3 Eyes: extraocular movements intact Mouth: oral pharynx moist, no lesions Neck: supple no lymphadenopathy Cardiovascular: heart regular rate and rhythm Lungs: clear to auscultation bilaterally Abdomen: soft, nontender, nondistended, no obvious ascites, no peritoneal signs, normal bowel sounds Extremities: no lower extremity edema bilaterally Skin: no lesions on visible extremities    Assessment and plan: 53 y.o. male with  chronic intermittent nonprogressive solid food dysphagia, routine risk for colon cancer  I explained to him several possibilities for the cause of his dysphagia including GERD, Schatzki's ring, eosinophilic esophagitis, neoplasm. Think it is highly unlikely that he has cancer causing this. We'll proceed with EGD  at his soonest convenience and he will start a trial of once daily over-the-counter omeprazole encased silent reflux is contributing. He is due for colon cancer screening and  we will proceed with colonoscopy at the same time as his EGD.

## 2013-09-22 NOTE — Patient Instructions (Signed)
Start OTC omeprazole, one pill once daily 20-30 min before BF meal. You will be set up for an upper endoscopy (for dysphagia, likely dilation). You will be set up for a colonoscopy for routine cancer screening.

## 2013-10-04 ENCOUNTER — Encounter: Payer: Self-pay | Admitting: Gastroenterology

## 2013-11-02 ENCOUNTER — Ambulatory Visit (AMBULATORY_SURGERY_CENTER): Payer: Federal, State, Local not specified - PPO | Admitting: Gastroenterology

## 2013-11-02 ENCOUNTER — Encounter: Payer: Self-pay | Admitting: Gastroenterology

## 2013-11-02 VITALS — BP 157/94 | HR 60 | Temp 97.8°F | Resp 13 | Ht 70.5 in | Wt 181.0 lb

## 2013-11-02 DIAGNOSIS — K2970 Gastritis, unspecified, without bleeding: Secondary | ICD-10-CM

## 2013-11-02 DIAGNOSIS — R933 Abnormal findings on diagnostic imaging of other parts of digestive tract: Secondary | ICD-10-CM

## 2013-11-02 DIAGNOSIS — K2990 Gastroduodenitis, unspecified, without bleeding: Secondary | ICD-10-CM

## 2013-11-02 DIAGNOSIS — D128 Benign neoplasm of rectum: Secondary | ICD-10-CM

## 2013-11-02 DIAGNOSIS — R1319 Other dysphagia: Secondary | ICD-10-CM

## 2013-11-02 DIAGNOSIS — Z1211 Encounter for screening for malignant neoplasm of colon: Secondary | ICD-10-CM

## 2013-11-02 DIAGNOSIS — D129 Benign neoplasm of anus and anal canal: Secondary | ICD-10-CM

## 2013-11-02 DIAGNOSIS — D126 Benign neoplasm of colon, unspecified: Secondary | ICD-10-CM

## 2013-11-02 MED ORDER — SODIUM CHLORIDE 0.9 % IV SOLN: 500.0000 mL | INTRAVENOUS | Status: DC

## 2013-11-02 NOTE — Patient Instructions (Signed)
YOU HAD AN ENDOSCOPIC PROCEDURE TODAY AT THE  ENDOSCOPY CENTER: Refer to the procedure report that was given to you for any specific questions about what was found during the examination.  If the procedure report does not answer your questions, please call your gastroenterologist to clarify.  If you requested that your care partner not be given the details of your procedure findings, then the procedure report has been included in a sealed envelope for you to review at your convenience later.  YOU SHOULD EXPECT: Some feelings of bloating in the abdomen. Passage of more gas than usual.  Walking can help get rid of the air that was put into your GI tract during the procedure and reduce the bloating. If you had a lower endoscopy (such as a colonoscopy or flexible sigmoidoscopy) you may notice spotting of blood in your stool or on the toilet paper. If you underwent a bowel prep for your procedure, then you may not have a normal bowel movement for a few days.  DIET: Your first meal following the procedure should be a light meal and then it is ok to progress to your normal diet.  A half-sandwich or bowl of soup is an example of a good first meal.  Heavy or fried foods are harder to digest and may make you feel nauseous or bloated.  Likewise meals heavy in dairy and vegetables can cause extra gas to form and this can also increase the bloating.  Drink plenty of fluids but you should avoid alcoholic beverages for 24 hours.  ACTIVITY: Your care partner should take you home directly after the procedure.  You should plan to take it easy, moving slowly for the rest of the day.  You can resume normal activity the day after the procedure however you should NOT DRIVE or use heavy machinery for 24 hours (because of the sedation medicines used during the test).    SYMPTOMS TO REPORT IMMEDIATELY: A gastroenterologist can be reached at any hour.  During normal business hours, 8:30 AM to 5:00 PM Monday through Friday,  call [phone].  After hours and on weekends, please call the GI answering service at [phone] who will take a message and have the physician on call contact you.   Following lower endoscopy (colonoscopy or flexible sigmoidoscopy):  Excessive amounts of blood in the stool  Significant tenderness or worsening of abdominal pains  Swelling of the abdomen that is new, acute  Fever of 100F or higher  Following upper endoscopy (EGD)  Vomiting of blood or coffee ground material  New chest pain or pain under the shoulder blades  Painful or persistently difficult swallowing  New shortness of breath  Fever of 100F or higher  Black, tarry-looking stools  FOLLOW UP: If any biopsies were taken you will be contacted by phone or by letter within the next 1-3 weeks.  Call your gastroenterologist if you have not heard about the biopsies in 3 weeks.  Our staff will call the home number listed on your records the next business day following your procedure to check on you and address any questions or concerns that you may have at that time regarding the information given to you following your procedure. This is a courtesy call and so if there is no answer at the home number and we have not heard from you through the emergency physician on call, we will assume that you have returned to your regular daily activities without incident.  SIGNATURES/CONFIDENTIALITY: You and/or your care   partner have signed paperwork which will be entered into your electronic medical record.  These signatures attest to the fact that that the information above on your After Visit Summary has been reviewed and is understood.  Full responsibility of the confidentiality of this discharge information lies with you and/or your care-partner.  Await pathology  Continue your normal medications  Please read handouts about polyps, diverticulosis, high fiber diets, and gastritis

## 2013-11-02 NOTE — Op Note (Signed)
[place]  [address] [place], [id number]   COLONOSCOPY PROCEDURE REPORT  PATIENT: [facility], Shannahan.  MR#: [id number] BIRTHDATE: 1960-09-04 , 53  yrs. old GENDER: Male ENDOSCOPIST: Milus Banister, MD REFERRED WL:[id number] Elease Hashimoto, M.D. PROCEDURE DATE:  11/02/2013 PROCEDURE:   Colonoscopy with snare polypectomy First Screening Colonoscopy - Avg.  risk and is 50 yrs.  old or older Yes.  Prior Negative Screening - Now for repeat screening. N/A  History of Adenoma - Now for follow-up colonoscopy & has been > or = to 3 yrs.  N/A  Polyps Removed Today? Yes. ASA CLASS:   Class II INDICATIONS:average risk screening. MEDICATIONS: MAC sedation, administered by CRNA and propofol (Diprivan) 400mg  IV  DESCRIPTION OF PROCEDURE:   After the risks benefits and alternatives of the procedure were thoroughly explained, informed consent was obtained.  A digital rectal exam revealed no abnormalities of the rectum.   The LB [id number]  endoscope was introduced through the anus and advanced to the cecum, which was identified by both the appendix and ileocecal valve. No adverse events experienced.   The quality of the prep was excellent.  The instrument was then slowly withdrawn as the colon was fully examined.   COLON FINDINGS: Two polyps were found, removed and sent to pathology.  These were both sessile, located in transverse and rectum, 2-93mm across, removed with cold snare.  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  The examination was otherwise normal.  Retroflexed views revealed no abnormalities. The time to cecum=2 minutes 50 seconds.  Withdrawal time=13 minutes 24 seconds. The scope was withdrawn and the procedure completed. COMPLICATIONS: There were no complications.  ENDOSCOPIC IMPRESSION: Two polyps were found, removed and sent to pathology. 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  The examination was otherwise  normal.  RECOMMENDATIONS: If the polyp(s) removed today are proven to be adenomatous (pre-cancerous) polyps, you will need a repeat colonoscopy in 5 years.  Otherwise you should continue to follow colorectal cancer screening guidelines for "routine risk" patients with colonoscopy in 10 years.  You will receive a letter within 1-2 weeks with the results of your biopsy as well as final recommendations.  Please call my office if you have not received a letter after 3 weeks.   eSigned:  Milus Banister, MD 11/02/2013 1:59 PM

## 2013-11-02 NOTE — Op Note (Signed)
[facility]  [address] [place], [id number]   ENDOSCOPY PROCEDURE REPORT  PATIENT: Walter [name], Walter [name].  MR#: [id number] BIRTHDATE: Aug 26, 1960 , 53  yrs. old GENDER: Male ENDOSCOPIST: Milus Banister, MD PROCEDURE DATE:  11/02/2013 PROCEDURE:  EGD w/ biopsy ASA CLASS:     Class II INDICATIONS:  intermittent dysphagia. MEDICATIONS: MAC sedation, administered by CRNA and propofol (Diprivan) 200mg  IV TOPICAL ANESTHETIC: none  DESCRIPTION OF PROCEDURE: After the risks benefits and alternatives of the procedure were thoroughly explained, informed consent was obtained.  The LB [id number] endoscope was introduced through the mouth and advanced to the second portion of the duodenum. Without limitations.  The instrument was slowly withdrawn as the mucosa was fully examined.   There was moderate pan-gastritis, non-specific.  The distal stomach was biopsied and sent to pathology.  There was a 1-2cm tongue of Barrett's appearing mucosa at the GE junction, no nodularity was present.  This was biopsied and sent to pathology.  The examination was otherwise normal.  Retroflexed views revealed no abnormalities. The scope was then withdrawn from the patient and the procedure completed.  COMPLICATIONS: There were no complications. ENDOSCOPIC IMPRESSION: There was moderate pan-gastritis, non-specific.  The distal stomach was biopsied and sent to pathology.  There was a 1-2cm tongue of Barrett's appearing mucosa at the GE junction, no nodularity was present.  This was biopsied and sent to pathology.  The examination was otherwise normal.  RECOMMENDATIONS: Await pathology from stomach and esophagus.   eSigned:  Milus Banister, MD 11/02/2013 2:08 PM   CC: Carolann Littler, MD

## 2013-11-02 NOTE — Progress Notes (Signed)
Called to room to assist during endoscopic procedure.  Patient ID and intended procedure confirmed with present staff. Received instructions for my participation in the procedure from the performing physician.  

## 2013-11-02 NOTE — Progress Notes (Signed)
Patient awakening,vss,report to rn 

## 2013-11-03 ENCOUNTER — Telehealth: Payer: Self-pay

## 2013-11-03 NOTE — Telephone Encounter (Signed)
  Follow up Call-  Call back number 11/02/2013  Post procedure Call Back phone  # [phone]  Permission to leave phone message Yes     Patient questions:  Do you have a fever, pain , or abdominal swelling? No. Pain Score  0 *  Have you tolerated food without any problems? Yes.    Have you been able to return to your normal activities? Yes.    Do you have any questions about your discharge instructions: Diet   No. Medications  No. Follow up visit  No.  Do you have questions or concerns about your Care? No.  Actions: * If pain score is 4 or above: No action needed, pain <4.   No problems per the pt. Maw

## 2013-11-10 ENCOUNTER — Other Ambulatory Visit: Payer: Self-pay

## 2013-11-10 MED ORDER — AMOXICILLIN 500 MG PO TABS: 1000.0000 mg | ORAL_TABLET | Freq: Two times a day (BID) | ORAL | Status: DC

## 2013-11-10 MED ORDER — CLARITHROMYCIN 500 MG PO TABS: 500.0000 mg | ORAL_TABLET | Freq: Two times a day (BID) | ORAL | Status: DC

## 2014-05-12 ENCOUNTER — Telehealth: Payer: Self-pay | Admitting: Family Medicine

## 2014-05-12 MED ORDER — INDOMETHACIN 50 MG PO CAPS: ORAL_CAPSULE | ORAL | Status: DC

## 2014-05-12 NOTE — Telephone Encounter (Signed)
Pt request refill indomethacin (INDOCIN) 50 MG capsule  [facility]

## 2014-05-12 NOTE — Telephone Encounter (Signed)
Rx sent to pharmacy   

## 2014-10-06 ENCOUNTER — Other Ambulatory Visit (INDEPENDENT_AMBULATORY_CARE_PROVIDER_SITE_OTHER): Payer: Federal, State, Local not specified - PPO

## 2014-10-06 DIAGNOSIS — M1000 Idiopathic gout, unspecified site: Secondary | ICD-10-CM

## 2014-10-06 DIAGNOSIS — Z0000 Encounter for general adult medical examination without abnormal findings: Secondary | ICD-10-CM

## 2014-10-06 DIAGNOSIS — M109 Gout, unspecified: Secondary | ICD-10-CM

## 2014-10-06 LAB — HEPATIC FUNCTION PANEL
ALK PHOS: 69 U/L (ref 39–117)
ALT: 29 U/L (ref 0–53)
AST: 25 U/L (ref 0–37)
Albumin: 4.3 g/dL (ref 3.5–5.2)
BILIRUBIN TOTAL: 0.8 mg/dL (ref 0.2–1.2)
Bilirubin, Direct: 0.2 mg/dL (ref 0.0–0.3)
TOTAL PROTEIN: 6.7 g/dL (ref 6.0–8.3)

## 2014-10-06 LAB — LIPID PANEL
CHOLESTEROL: 127 mg/dL (ref 0–200)
HDL: 47.8 mg/dL (ref >39.00)
LDL CALC: 48 mg/dL (ref 0–99)
NonHDL: 79.2
Total CHOL/HDL Ratio: 3
Triglycerides: 155 mg/dL — ABNORMAL HIGH (ref 0.0–149.0)
VLDL: 31 mg/dL (ref 0.0–40.0)

## 2014-10-06 LAB — BASIC METABOLIC PANEL
BUN: 8 mg/dL (ref 6–23)
CO2: 28 meq/L (ref 19–32)
CREATININE: 0.96 mg/dL (ref 0.40–1.50)
Calcium: 9.7 mg/dL (ref 8.4–10.5)
Chloride: 104 mEq/L (ref 96–112)
GFR: 87.38 mL/min (ref >60.00)
Glucose, Bld: 99 mg/dL (ref 70–99)
Potassium: 4.4 mEq/L (ref 3.5–5.1)
Sodium: 140 mEq/L (ref 135–145)

## 2014-10-06 LAB — CBC WITH DIFFERENTIAL/PLATELET
BASOS PCT: 0.5 % (ref 0.0–3.0)
Basophils Absolute: 0 10*3/uL (ref 0.0–0.1)
Eosinophils Absolute: 0.2 10*3/uL (ref 0.0–0.7)
Eosinophils Relative: 3.4 % (ref 0.0–5.0)
HCT: 40.6 % (ref 39.0–52.0)
Hemoglobin: 13.6 g/dL (ref 13.0–17.0)
Lymphocytes Relative: 26 % (ref 12.0–46.0)
Lymphs Abs: 1.8 10*3/uL (ref 0.7–4.0)
MCHC: 33.4 g/dL (ref 30.0–36.0)
MCV: 88.1 fl (ref 78.0–100.0)
MONO ABS: 0.5 10*3/uL (ref 0.1–1.0)
Monocytes Relative: 7.8 % (ref 3.0–12.0)
NEUTROS PCT: 62.3 % (ref 43.0–77.0)
Neutro Abs: 4.3 10*3/uL (ref 1.4–7.7)
PLATELETS: 224 10*3/uL (ref 150.0–400.0)
RBC: 4.61 Mil/uL (ref 4.22–5.81)
RDW: 12 % (ref 11.5–15.5)
WBC: 6.8 10*3/uL (ref 4.0–10.5)

## 2014-10-06 LAB — TSH: TSH: 2.13 u[IU]/mL (ref 0.35–4.50)

## 2014-10-06 LAB — PSA: PSA: 0.35 ng/mL (ref 0.10–4.00)

## 2014-10-06 LAB — URIC ACID: Uric Acid, Serum: 10.8 mg/dL — ABNORMAL HIGH (ref 4.0–7.8)

## 2014-10-17 ENCOUNTER — Encounter: Payer: Self-pay | Admitting: Family Medicine

## 2014-10-17 ENCOUNTER — Ambulatory Visit (INDEPENDENT_AMBULATORY_CARE_PROVIDER_SITE_OTHER): Payer: Federal, State, Local not specified - PPO | Admitting: Family Medicine

## 2014-10-17 VITALS — BP 128/82 | HR 87 | Temp 98.2°F | Ht 70.5 in | Wt 180.0 lb

## 2014-10-17 DIAGNOSIS — Z23 Encounter for immunization: Secondary | ICD-10-CM

## 2014-10-17 DIAGNOSIS — Z0000 Encounter for general adult medical examination without abnormal findings: Secondary | ICD-10-CM | POA: Diagnosis not present

## 2014-10-17 DIAGNOSIS — R109 Unspecified abdominal pain: Secondary | ICD-10-CM

## 2014-10-17 MED ORDER — OXYCODONE-ACETAMINOPHEN 5-325 MG PO TABS: 1.0000 | ORAL_TABLET | ORAL | Status: DC | PRN

## 2014-10-17 MED ORDER — INDOMETHACIN 50 MG PO CAPS: ORAL_CAPSULE | ORAL | Status: DC

## 2014-10-17 NOTE — Progress Notes (Signed)
   Subjective:    Patient ID: Walter [name], male    DOB: 1960/05/01, 54 y.o.   MRN: [id number]  HPI Patient here for complete physical. Has history of gout but no recent flareups. Takes occasional indomethacin. Nonsmoker. He's been exercising more during the past year and has lost some weight. Overall feels well. Last tetanus unknown.  Past Medical History  Diagnosis Date  . Arthritis   . Gout    No past surgical history on file.  reports that he has never smoked. He has never used smokeless tobacco. He reports that he drinks alcohol. He reports that he does not use illicit drugs. family history includes Alcohol abuse in an other family member; Arthritis in his mother; Diabetes in his father; Heart disease (age of onset: 32) in his father; Hyperlipidemia in his father; Hypertension in his father. There is no history of Colon cancer, Stomach cancer, or Esophageal cancer. Allergies  Allergen Reactions  . Indocin [Indomethacin] Rash      Review of Systems  Constitutional: Negative for fever, activity change, appetite change and fatigue.  HENT: Negative for congestion, ear pain and trouble swallowing.   Eyes: Negative for pain and visual disturbance.  Respiratory: Negative for cough, shortness of breath and wheezing.   Cardiovascular: Negative for chest pain and palpitations.  Gastrointestinal: Negative for nausea, vomiting, abdominal pain, diarrhea, constipation, blood in stool, abdominal distention and rectal pain.  Genitourinary: Negative for dysuria, hematuria and testicular pain.  Musculoskeletal: Negative for joint swelling and arthralgias.  Skin: Negative for rash.  Neurological: Negative for dizziness, syncope and headaches.  Hematological: Negative for adenopathy.  Psychiatric/Behavioral: Negative for confusion and dysphoric mood.       Objective:   Physical Exam  Constitutional: He is oriented to person, place, and time. He appears well-developed and well-nourished. No  distress.  HENT:  Head: Normocephalic and atraumatic.  Right Ear: External ear normal.  Left Ear: External ear normal.  Mouth/Throat: Oropharynx is clear and moist.  Eyes: Conjunctivae and EOM are normal. Pupils are equal, round, and reactive to light.  Neck: Normal range of motion. Neck supple. No thyromegaly present.  Cardiovascular: Normal rate, regular rhythm and normal heart sounds.   No murmur heard. Pulmonary/Chest: No respiratory distress. He has no wheezes. He has no rales.  Abdominal: Soft. Bowel sounds are normal. He exhibits no distension and no mass. There is no tenderness. There is no rebound and no guarding.  Musculoskeletal: He exhibits no edema.  Lymphadenopathy:    He has no cervical adenopathy.  Neurological: He is alert and oriented to person, place, and time. He displays normal reflexes. No cranial nerve deficit.  Skin: No rash noted.  Psychiatric: He has a normal mood and affect.          Assessment & Plan:  Complete physical. Labs reviewed. No major concerns. Tetanus booster given. Continue regular exercise habits. Refill indomethacin for as needed use. We gave him 1 refill oxycodone which he uses for severe back pain very infrequently.

## 2014-10-17 NOTE — Progress Notes (Signed)
Pre visit review using our clinic review tool, if applicable. No additional management support is needed unless otherwise documented below in the visit note. 

## 2014-10-17 NOTE — Addendum Note (Signed)
Addended by: Marcina Millard on: 10/17/2014 10:51 AM   Modules accepted: Orders

## 2015-08-11 ENCOUNTER — Other Ambulatory Visit: Payer: Self-pay | Admitting: Family Medicine

## 2015-08-11 MED ORDER — INDOMETHACIN 50 MG PO CAPS: ORAL_CAPSULE | ORAL | Status: DC

## 2015-08-11 NOTE — Telephone Encounter (Signed)
Last seen 10/17/14 by you, last filled on 10/17/14.

## 2015-08-11 NOTE — Telephone Encounter (Signed)
Refill once only. 

## 2015-10-05 ENCOUNTER — Other Ambulatory Visit (INDEPENDENT_AMBULATORY_CARE_PROVIDER_SITE_OTHER): Payer: Federal, State, Local not specified - PPO

## 2015-10-05 DIAGNOSIS — M109 Gout, unspecified: Secondary | ICD-10-CM | POA: Diagnosis not present

## 2015-10-05 DIAGNOSIS — Z0000 Encounter for general adult medical examination without abnormal findings: Secondary | ICD-10-CM

## 2015-10-05 LAB — LIPID PANEL
Cholesterol: 142 mg/dL (ref 0–200)
HDL: 54.5 mg/dL (ref >39.00)
LDL Cholesterol: 49 mg/dL (ref 0–99)
NONHDL: 87.72
Total CHOL/HDL Ratio: 3
Triglycerides: 192 mg/dL — ABNORMAL HIGH (ref 0.0–149.0)
VLDL: 38.4 mg/dL (ref 0.0–40.0)

## 2015-10-05 LAB — CBC WITH DIFFERENTIAL/PLATELET
BASOS ABS: 0.1 10*3/uL (ref 0.0–0.1)
Basophils Relative: 0.8 % (ref 0.0–3.0)
Eosinophils Absolute: 0.6 10*3/uL (ref 0.0–0.7)
Eosinophils Relative: 10.2 % — ABNORMAL HIGH (ref 0.0–5.0)
HEMATOCRIT: 40.7 % (ref 39.0–52.0)
Hemoglobin: 13.8 g/dL (ref 13.0–17.0)
LYMPHS PCT: 25.9 % (ref 12.0–46.0)
Lymphs Abs: 1.6 10*3/uL (ref 0.7–4.0)
MCHC: 34 g/dL (ref 30.0–36.0)
MCV: 85.9 fl (ref 78.0–100.0)
MONOS PCT: 8.5 % (ref 3.0–12.0)
Monocytes Absolute: 0.5 10*3/uL (ref 0.1–1.0)
NEUTROS ABS: 3.5 10*3/uL (ref 1.4–7.7)
Neutrophils Relative %: 54.6 % (ref 43.0–77.0)
PLATELETS: 223 10*3/uL (ref 150.0–400.0)
RBC: 4.74 Mil/uL (ref 4.22–5.81)
RDW: 12.2 % (ref 11.5–15.5)
WBC: 6.3 10*3/uL (ref 4.0–10.5)

## 2015-10-05 LAB — HEPATIC FUNCTION PANEL
ALBUMIN: 4.5 g/dL (ref 3.5–5.2)
ALK PHOS: 77 U/L (ref 39–117)
ALT: 42 U/L (ref 0–53)
AST: 33 U/L (ref 0–37)
Bilirubin, Direct: 0.1 mg/dL (ref 0.0–0.3)
TOTAL PROTEIN: 6.7 g/dL (ref 6.0–8.3)
Total Bilirubin: 0.4 mg/dL (ref 0.2–1.2)

## 2015-10-05 LAB — BASIC METABOLIC PANEL
BUN: 10 mg/dL (ref 6–23)
CALCIUM: 9.6 mg/dL (ref 8.4–10.5)
CO2: 28 meq/L (ref 19–32)
Chloride: 103 mEq/L (ref 96–112)
Creatinine, Ser: 0.91 mg/dL (ref 0.40–1.50)
GFR: 92.59 mL/min (ref >60.00)
Glucose, Bld: 78 mg/dL (ref 70–99)
POTASSIUM: 4.7 meq/L (ref 3.5–5.1)
SODIUM: 139 meq/L (ref 135–145)

## 2015-10-05 LAB — PSA: PSA: 0.59 ng/mL (ref 0.10–4.00)

## 2015-10-05 LAB — TSH: TSH: 2.16 u[IU]/mL (ref 0.35–4.50)

## 2015-10-05 LAB — URIC ACID: URIC ACID, SERUM: 11 mg/dL — AB (ref 4.0–7.8)

## 2015-10-23 ENCOUNTER — Ambulatory Visit (INDEPENDENT_AMBULATORY_CARE_PROVIDER_SITE_OTHER): Payer: Federal, State, Local not specified - PPO | Admitting: Family Medicine

## 2015-10-23 ENCOUNTER — Encounter: Payer: Self-pay | Admitting: Family Medicine

## 2015-10-23 VITALS — BP 138/90 | HR 75 | Temp 98.3°F | Ht 70.5 in | Wt 179.3 lb

## 2015-10-23 DIAGNOSIS — Z0000 Encounter for general adult medical examination without abnormal findings: Secondary | ICD-10-CM | POA: Diagnosis not present

## 2015-10-23 DIAGNOSIS — R109 Unspecified abdominal pain: Secondary | ICD-10-CM

## 2015-10-23 MED ORDER — OXYCODONE-ACETAMINOPHEN 5-325 MG PO TABS: 1.0000 | ORAL_TABLET | ORAL | Status: DC | PRN

## 2015-10-23 MED ORDER — ALLOPURINOL 100 MG PO TABS: ORAL_TABLET | ORAL | Status: DC

## 2015-10-23 MED ORDER — COLCHICINE 0.6 MG PO TABS: ORAL_TABLET | ORAL | Status: DC

## 2015-10-23 MED ORDER — INDOMETHACIN 50 MG PO CAPS: ORAL_CAPSULE | ORAL | Status: DC

## 2015-10-23 NOTE — Patient Instructions (Signed)
Take Allopurinol one daily for two weeks and then two daily for two weeks and then three daily.

## 2015-10-23 NOTE — Progress Notes (Signed)
Pre visit review using our clinic review tool, if applicable. No additional management support is needed unless otherwise documented below in the visit note. 

## 2015-10-23 NOTE — Progress Notes (Signed)
Subjective:     Patient ID: Walter [name], male   DOB: July 20, 1960, 55 y.o.   MRN: [id number]  HPI Patient seen for physical exam He has history of gout and quit taking allopurinol several months ago. Since that time, he had much more frequent flareups. He has taken indomethacin for acute relief. He is doing very well on allopurinol for suppression. Generally stays well-hydrated. He is starting to develop more severe osteoarthritis changes especially in the hands  Tetanus up-to-date. Colonoscopy up-to-date. Not consistently exercising. Nonsmoker.  Family history reviewed and indicated as below with no changes  Past Medical History  Diagnosis Date  . Arthritis   . Gout    History reviewed. No pertinent past surgical history.  reports that he has never smoked. He has never used smokeless tobacco. He reports that he drinks alcohol. He reports that he does not use illicit drugs. family history includes Arthritis in his mother; Diabetes in his father; Heart disease (age of onset: 9) in his father; Hyperlipidemia in his father; Hypertension in his father. There is no history of Colon cancer, Stomach cancer, or Esophageal cancer. Allergies  Allergen Reactions  . Indocin [Indomethacin] Rash      Review of Systems  Constitutional: Negative for fever, activity change, appetite change and fatigue.  HENT: Negative for congestion, ear pain and trouble swallowing.   Eyes: Negative for pain and visual disturbance.  Respiratory: Negative for cough, shortness of breath and wheezing.   Cardiovascular: Negative for chest pain and palpitations.  Gastrointestinal: Negative for nausea, vomiting, abdominal pain, diarrhea, constipation, blood in stool, abdominal distention and rectal pain.  Genitourinary: Negative for dysuria, hematuria and testicular pain.  Musculoskeletal: Positive for arthralgias. Negative for joint swelling.  Skin: Negative for rash.  Neurological: Negative for dizziness, syncope  and headaches.  Hematological: Negative for adenopathy.  Psychiatric/Behavioral: Negative for confusion and dysphoric mood.       Objective:   Physical Exam  Constitutional: He is oriented to person, place, and time. He appears well-developed and well-nourished. No distress.  HENT:  Head: Normocephalic and atraumatic.  Right Ear: External ear normal.  Left Ear: External ear normal.  Mouth/Throat: Oropharynx is clear and moist.  Eyes: Conjunctivae and EOM are normal. Pupils are equal, round, and reactive to light.  Neck: Normal range of motion. Neck supple. No thyromegaly present.  Cardiovascular: Normal rate, regular rhythm and normal heart sounds.   No murmur heard. Pulmonary/Chest: No respiratory distress. He has no wheezes. He has no rales.  Abdominal: Soft. Bowel sounds are normal. He exhibits no distension and no mass. There is no tenderness. There is no rebound and no guarding.  Musculoskeletal: He exhibits no edema.  He has some early nodule formation DIP and PIP joints consistent with osteoarthritis  Lymphadenopathy:    He has no cervical adenopathy.  Neurological: He is alert and oriented to person, place, and time. He displays normal reflexes. No cranial nerve deficit.  Skin: No rash noted.  Psychiatric: He has a normal mood and affect.       Assessment:     Complete physical. Patient has gout with more frequent flareups recently off allopurinol    Plan:     -Labs reviewed. He has dyslipidemia with high triglycerides. No other major concerns other than elevated uric acid level. -Recommend start back allopurinol and titrate gradually to dose of 300 mg daily -Use Colchicine 0.6 mg 1 daily during titration phase to reduce likelihood of acute gout during titration -1 refill of  Percocet given which he uses very infrequently for severe arthritis pain -Consider repeat uric acid level at about 3 months after he is on full dose of allopurinol with goal uric acid less than  6  Eulas Post MD [facility]

## 2016-01-23 ENCOUNTER — Telehealth: Payer: Self-pay | Admitting: Family Medicine

## 2016-01-23 MED ORDER — INDOMETHACIN 50 MG PO CAPS: ORAL_CAPSULE | ORAL | 1 refills | Status: DC

## 2016-01-23 NOTE — Telephone Encounter (Signed)
Medication sent in for patient. 

## 2016-01-23 NOTE — Telephone Encounter (Signed)
Pt need new Rx for indomethacin (INDOCIN)  50 mg   Pharm:  [facility]

## 2016-03-31 DIAGNOSIS — J209 Acute bronchitis, unspecified: Secondary | ICD-10-CM | POA: Diagnosis not present

## 2016-03-31 DIAGNOSIS — R030 Elevated blood-pressure reading, without diagnosis of hypertension: Secondary | ICD-10-CM | POA: Diagnosis not present

## 2016-07-22 ENCOUNTER — Other Ambulatory Visit: Payer: Self-pay | Admitting: Family Medicine

## 2016-09-20 ENCOUNTER — Other Ambulatory Visit: Payer: Self-pay | Admitting: Family Medicine

## 2016-11-30 ENCOUNTER — Other Ambulatory Visit: Payer: Self-pay | Admitting: Family Medicine

## 2017-01-28 ENCOUNTER — Other Ambulatory Visit: Payer: Self-pay | Admitting: Family Medicine

## 2017-03-04 DIAGNOSIS — J208 Acute bronchitis due to other specified organisms: Secondary | ICD-10-CM | POA: Diagnosis not present

## 2017-05-05 ENCOUNTER — Other Ambulatory Visit: Payer: Self-pay | Admitting: Family Medicine

## 2017-06-09 ENCOUNTER — Encounter: Payer: Federal, State, Local not specified - PPO | Admitting: Family Medicine

## 2017-06-09 DIAGNOSIS — Z0289 Encounter for other administrative examinations: Secondary | ICD-10-CM

## 2017-07-22 ENCOUNTER — Telehealth: Payer: Self-pay | Admitting: Family Medicine

## 2017-07-22 NOTE — Telephone Encounter (Signed)
Refill denied.  Patient  needs an office visit

## 2017-07-22 NOTE — Telephone Encounter (Signed)
Copied from [place]. Topic: Quick Communication - Rx Refill/Question >> Jul 22, 2017  8:32 AM Bea Graff, NT wrote: Medication: indomethacin (INDOCIN) 50 MG capsule  Has the patient contacted their pharmacy? Yes.   (Agent: If no, request that the patient contact the pharmacy for the refill.) Preferred Pharmacy (with phone number or street name): [facility] [place], [place] - [address] [phone] (Phone) [phone] (Fax)   Agent: Please be advised that RX refills may take up to 3 business days. We ask that you follow-up with your pharmacy.

## 2017-07-22 NOTE — Telephone Encounter (Signed)
Refill for Indocin   NOV  08/04/17  LOV  10/23/15 for a physical  Provider.. Dr. Carolann Littler  Pharmacy  [facility], [phone]  [place]  Please review.

## 2017-08-04 ENCOUNTER — Ambulatory Visit (INDEPENDENT_AMBULATORY_CARE_PROVIDER_SITE_OTHER): Payer: Federal, State, Local not specified - PPO | Admitting: Family Medicine

## 2017-08-04 ENCOUNTER — Encounter: Payer: Self-pay | Admitting: Family Medicine

## 2017-08-04 VITALS — BP 130/90 | HR 81 | Temp 98.5°F | Ht 71.0 in | Wt 184.5 lb

## 2017-08-04 DIAGNOSIS — Z8249 Family history of ischemic heart disease and other diseases of the circulatory system: Secondary | ICD-10-CM | POA: Diagnosis not present

## 2017-08-04 DIAGNOSIS — Z0000 Encounter for general adult medical examination without abnormal findings: Secondary | ICD-10-CM | POA: Diagnosis not present

## 2017-08-04 DIAGNOSIS — R109 Unspecified abdominal pain: Secondary | ICD-10-CM

## 2017-08-04 LAB — HEPATIC FUNCTION PANEL
ALT: 41 U/L (ref 0–53)
AST: 30 U/L (ref 0–37)
Albumin: 4.7 g/dL (ref 3.5–5.2)
Alkaline Phosphatase: 75 U/L (ref 39–117)
BILIRUBIN TOTAL: 0.4 mg/dL (ref 0.2–1.2)
Bilirubin, Direct: 0.1 mg/dL (ref 0.0–0.3)
Total Protein: 7 g/dL (ref 6.0–8.3)

## 2017-08-04 LAB — CBC WITH DIFFERENTIAL/PLATELET
Basophils Absolute: 0.1 10*3/uL (ref 0.0–0.1)
Basophils Relative: 0.9 % (ref 0.0–3.0)
EOS ABS: 0.4 10*3/uL (ref 0.0–0.7)
Eosinophils Relative: 6 % — ABNORMAL HIGH (ref 0.0–5.0)
HEMATOCRIT: 43.8 % (ref 39.0–52.0)
Hemoglobin: 14.7 g/dL (ref 13.0–17.0)
LYMPHS ABS: 1.7 10*3/uL (ref 0.7–4.0)
Lymphocytes Relative: 28.8 % (ref 12.0–46.0)
MCHC: 33.6 g/dL (ref 30.0–36.0)
MCV: 87.8 fl (ref 78.0–100.0)
MONO ABS: 0.5 10*3/uL (ref 0.1–1.0)
Monocytes Relative: 8.2 % (ref 3.0–12.0)
NEUTROS ABS: 3.4 10*3/uL (ref 1.4–7.7)
NEUTROS PCT: 56.1 % (ref 43.0–77.0)
PLATELETS: 233 10*3/uL (ref 150.0–400.0)
RBC: 4.99 Mil/uL (ref 4.22–5.81)
RDW: 12.2 % (ref 11.5–15.5)
WBC: 6 10*3/uL (ref 4.0–10.5)

## 2017-08-04 LAB — LIPID PANEL
CHOL/HDL RATIO: 3
CHOLESTEROL: 155 mg/dL (ref 0–200)
HDL: 55.7 mg/dL (ref >39.00)
LDL CALC: 74 mg/dL (ref 0–99)
NonHDL: 99.4
TRIGLYCERIDES: 125 mg/dL (ref 0.0–149.0)
VLDL: 25 mg/dL (ref 0.0–40.0)

## 2017-08-04 LAB — TSH: TSH: 2.19 u[IU]/mL (ref 0.35–4.50)

## 2017-08-04 LAB — URIC ACID: URIC ACID, SERUM: 10.2 mg/dL — AB (ref 4.0–7.8)

## 2017-08-04 LAB — BASIC METABOLIC PANEL
BUN: 10 mg/dL (ref 6–23)
CHLORIDE: 104 meq/L (ref 96–112)
CO2: 27 meq/L (ref 19–32)
Calcium: 9.8 mg/dL (ref 8.4–10.5)
Creatinine, Ser: 0.89 mg/dL (ref 0.40–1.50)
GFR: 94.34 mL/min (ref >60.00)
Glucose, Bld: 90 mg/dL (ref 70–99)
POTASSIUM: 5.4 meq/L — AB (ref 3.5–5.1)
Sodium: 140 mEq/L (ref 135–145)

## 2017-08-04 LAB — PSA: PSA: 0.43 ng/mL (ref 0.10–4.00)

## 2017-08-04 MED ORDER — OXYCODONE-ACETAMINOPHEN 5-325 MG PO TABS: 1.0000 | ORAL_TABLET | Freq: Four times a day (QID) | ORAL | 0 refills | Status: DC | PRN

## 2017-08-04 MED ORDER — INDOMETHACIN 50 MG PO CAPS: 50.0000 mg | ORAL_CAPSULE | Freq: Three times a day (TID) | ORAL | 0 refills | Status: DC | PRN

## 2017-08-04 NOTE — Progress Notes (Signed)
  Subjective:     Patient ID: Walter [name], male   DOB: Nov 20, 1960, 57 y.o.   MRN: [id number]  HPI  Patient seen for physical exam. He has history of gout but no recent flareups. He requests refill indomethacin and oxycodone to have on hand for any acute flareups. He used to take allopurinoal and has only had one flareup since stopping this about a year and a half ago  Nonsmoker. Still exercises with running 2 days per week.  Works with [employer]. He plans to retire in a little over one year from now. No history of prior shingles vaccine. No history of hepatitis C testing. Low risk. Tetanus up-to-date. Will need colonoscopy in one year  Family history significant for father having coronary disease at age 33.  Past Medical History:  Diagnosis Date  . Arthritis   . Gout    No past surgical history on file.  reports that he has never smoked. He has never used smokeless tobacco. He reports that he drinks alcohol. He reports that he does not use drugs. family history includes Alcohol abuse in his unknown relative; Arthritis in his mother; Diabetes in his father; Heart disease (age of onset: 62) in his father; Hyperlipidemia in his father; Hypertension in his father. No Active Allergies  Review of Systems  Constitutional: Negative for activity change, appetite change, fatigue, fever and unexpected weight change.  HENT: Negative for congestion, ear pain and trouble swallowing.   Eyes: Negative for pain and visual disturbance.  Respiratory: Negative for cough, shortness of breath and wheezing.   Cardiovascular: Negative for chest pain and palpitations.  Gastrointestinal: Negative for abdominal distention, abdominal pain, blood in stool, constipation, diarrhea, nausea, rectal pain and vomiting.  Genitourinary: Negative for dysuria and hematuria.  Musculoskeletal: Negative for arthralgias and joint swelling.  Skin: Negative for rash.  Neurological: Negative for dizziness, syncope and  headaches.  Hematological: Negative for adenopathy.  Psychiatric/Behavioral: Negative for confusion and dysphoric mood.       Objective:   Physical Exam  Constitutional: He appears well-developed and well-nourished.  HENT:  Head: Normocephalic and atraumatic.  Right Ear: External ear normal.  Left Ear: External ear normal.  Mouth/Throat: Oropharynx is clear and moist.  Neck: Neck supple. No thyromegaly present.  Cardiovascular: Normal rate, regular rhythm and normal heart sounds.  No murmur heard. Pulmonary/Chest: Effort normal and breath sounds normal. He has no wheezes. He has no rales.  Abdominal: Soft. He exhibits no mass. There is no tenderness. There is no guarding.  Musculoskeletal: He exhibits no edema.  Lymphadenopathy:    He has no cervical adenopathy.  Skin: No rash noted.  Psychiatric: He has a normal mood and affect. His behavior is normal.       Assessment:     Physical exam. He has history of gout which has been stable without prophylactic medication recently. We discussed the following issues today    Plan:     -Obtain screening lab work -Include hepatitis C antibody -Discussed new shingles vaccine and he will check on coverage if interested -Refilled indomethacin for as needed use. We provided 1 refill of Percocet No. 20 tablets to use only for severe pain with gout -Patient had interest in further risk stratification for coronary disease given family history as above. We discussed coronary calcium scoring and he definitiely has some interest in getting this scheduled  Eulas Post MD [facility] at [facility]

## 2017-08-05 LAB — HEPATITIS C ANTIBODY
HEP C AB: NONREACTIVE
SIGNAL TO CUT-OFF: 0.01 (ref <1.00)

## 2017-10-29 ENCOUNTER — Other Ambulatory Visit: Payer: Self-pay | Admitting: Family Medicine

## 2017-10-29 NOTE — Telephone Encounter (Signed)
Last OV 08/04/2017   Last refilled 08/04/2017 disp 90 with no refills   Sent to PCP for approval

## 2017-10-29 NOTE — Telephone Encounter (Signed)
Refill once 

## 2017-11-08 ENCOUNTER — Other Ambulatory Visit: Payer: Self-pay | Admitting: Family Medicine

## 2018-05-28 ENCOUNTER — Other Ambulatory Visit: Payer: Self-pay | Admitting: Family Medicine

## 2018-06-01 ENCOUNTER — Encounter: Payer: Self-pay | Admitting: Family Medicine

## 2018-06-01 ENCOUNTER — Ambulatory Visit: Payer: Federal, State, Local not specified - PPO | Admitting: Family Medicine

## 2018-06-01 ENCOUNTER — Other Ambulatory Visit: Payer: Self-pay

## 2018-06-01 DIAGNOSIS — Z85828 Personal history of other malignant neoplasm of skin: Secondary | ICD-10-CM

## 2018-06-01 DIAGNOSIS — Z860101 Personal history of adenomatous and serrated colon polyps: Secondary | ICD-10-CM | POA: Insufficient documentation

## 2018-06-01 DIAGNOSIS — Z86010 Personal history of colonic polyps: Secondary | ICD-10-CM | POA: Diagnosis not present

## 2018-06-01 NOTE — Progress Notes (Signed)
  Subjective:     Patient ID: Walter [name], male   DOB: 1960-05-10, 58 y.o.   MRN: [id number]  HPI Patient here today basically requesting a couple referrals.  First he has history of adenomatous colon polyps back in 2015.  Due for follow-up colonoscopy this year.  He plans to retire soon and is trying to get some things done before that. No family history of colon cancer.  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  He is requesting Derm referral for basic skin check.  He is not aware of any concerning lesions  Asking for recommendations regarding good basic eye care.  Has not had eye exam in several years.  Past Medical History:  Diagnosis Date  . Arthritis   . Gout    History reviewed. No pertinent surgical history.  reports that he has never smoked. He has never used smokeless tobacco. He reports current alcohol use. He reports that he does not use drugs. family history includes Alcohol abuse in an other family member; Arthritis in his mother; Diabetes in his father; Heart disease (age of onset: 75) in his father; Hyperlipidemia in his father; Hypertension in his father. Allergies  Allergen Reactions  . Amoxicillin Rash  . Poison Ivy Extract Rash     Review of Systems  Constitutional: Negative for appetite change and unexpected weight change.  Gastrointestinal: Negative for blood in stool, constipation and diarrhea.  Skin: Negative for rash.       Objective:   Physical Exam Constitutional:      Appearance: Normal appearance.  Cardiovascular:     Rate and Rhythm: Normal rate and regular rhythm.  Pulmonary:     Effort: Pulmonary effort is normal.     Breath sounds: Normal breath sounds.  Skin:    Comments: No concerning skin lesions noted  Neurological:     Mental Status: He is alert.        Assessment:     #1 history of basal cell skin cancer involving the face  #2 history of adenomatous colon polyp approximately 5  years ago    Plan:     -Set up Derm referral and GI referral for repeat colonoscopy -We gave him the name of a local optometrist to consider for basic eye exam -He will set up physical for some time later this spring or summer  Eulas Post MD [facility] at [facility]

## 2018-06-01 NOTE — Patient Instructions (Signed)
Consider setting up eye exam- consider Dr Macarthur Critchley with [facility].

## 2018-06-12 ENCOUNTER — Encounter: Payer: Self-pay | Admitting: Gastroenterology

## 2018-07-13 ENCOUNTER — Telehealth: Payer: Self-pay | Admitting: *Deleted

## 2018-07-13 ENCOUNTER — Other Ambulatory Visit: Payer: Self-pay

## 2018-07-13 NOTE — Telephone Encounter (Signed)
Call to do PV over the phone and pt. Stated "let me just save you some time,let me call back and reschedule in a couple of months when all of this is over with,offered to go ahead and reschedule now but he said no that he will call back and reschedule".

## 2018-07-14 NOTE — Progress Notes (Signed)
Pt cancelled appointments.  

## 2018-07-27 ENCOUNTER — Encounter: Payer: Federal, State, Local not specified - PPO | Admitting: Gastroenterology

## 2018-08-10 ENCOUNTER — Encounter: Payer: Federal, State, Local not specified - PPO | Admitting: Gastroenterology

## 2018-10-21 ENCOUNTER — Encounter: Payer: Self-pay | Admitting: Gastroenterology

## 2018-11-16 ENCOUNTER — Encounter: Payer: Self-pay | Admitting: Family Medicine

## 2018-11-16 ENCOUNTER — Ambulatory Visit (INDEPENDENT_AMBULATORY_CARE_PROVIDER_SITE_OTHER): Payer: Federal, State, Local not specified - PPO | Admitting: Family Medicine

## 2018-11-16 ENCOUNTER — Other Ambulatory Visit: Payer: Self-pay

## 2018-11-16 VITALS — BP 118/80 | HR 74 | Temp 98.1°F | Ht 70.0 in | Wt 185.2 lb

## 2018-11-16 DIAGNOSIS — Z0000 Encounter for general adult medical examination without abnormal findings: Secondary | ICD-10-CM

## 2018-11-16 DIAGNOSIS — R109 Unspecified abdominal pain: Secondary | ICD-10-CM

## 2018-11-16 DIAGNOSIS — Z125 Encounter for screening for malignant neoplasm of prostate: Secondary | ICD-10-CM | POA: Diagnosis not present

## 2018-11-16 LAB — LIPID PANEL
Cholesterol: 158 mg/dL (ref 0–200)
HDL: 55.9 mg/dL (ref >39.00)
LDL Cholesterol: 74 mg/dL (ref 0–99)
NonHDL: 101.62
Total CHOL/HDL Ratio: 3
Triglycerides: 139 mg/dL (ref 0.0–149.0)
VLDL: 27.8 mg/dL (ref 0.0–40.0)

## 2018-11-16 LAB — CBC WITH DIFFERENTIAL/PLATELET
Basophils Absolute: 0.1 10*3/uL (ref 0.0–0.1)
Basophils Relative: 0.9 % (ref 0.0–3.0)
Eosinophils Absolute: 0.7 10*3/uL (ref 0.0–0.7)
Eosinophils Relative: 9.5 % — ABNORMAL HIGH (ref 0.0–5.0)
HCT: 42.7 % (ref 39.0–52.0)
Hemoglobin: 14.1 g/dL (ref 13.0–17.0)
Lymphocytes Relative: 24.8 % (ref 12.0–46.0)
Lymphs Abs: 1.8 10*3/uL (ref 0.7–4.0)
MCHC: 33 g/dL (ref 30.0–36.0)
MCV: 90.4 fl (ref 78.0–100.0)
Monocytes Absolute: 0.6 10*3/uL (ref 0.1–1.0)
Monocytes Relative: 8.7 % (ref 3.0–12.0)
Neutro Abs: 4.1 10*3/uL (ref 1.4–7.7)
Neutrophils Relative %: 56.1 % (ref 43.0–77.0)
Platelets: 203 10*3/uL (ref 150.0–400.0)
RBC: 4.73 Mil/uL (ref 4.22–5.81)
RDW: 13.1 % (ref 11.5–15.5)
WBC: 7.3 10*3/uL (ref 4.0–10.5)

## 2018-11-16 LAB — BASIC METABOLIC PANEL
BUN: 9 mg/dL (ref 6–23)
CO2: 27 mEq/L (ref 19–32)
Calcium: 10.1 mg/dL (ref 8.4–10.5)
Chloride: 102 mEq/L (ref 96–112)
Creatinine, Ser: 0.96 mg/dL (ref 0.40–1.50)
GFR: 80.96 mL/min (ref >60.00)
Glucose, Bld: 95 mg/dL (ref 70–99)
Potassium: 5.1 mEq/L (ref 3.5–5.1)
Sodium: 139 mEq/L (ref 135–145)

## 2018-11-16 LAB — HEPATIC FUNCTION PANEL
ALT: 58 U/L — ABNORMAL HIGH (ref 0–53)
AST: 46 U/L — ABNORMAL HIGH (ref 0–37)
Albumin: 4.7 g/dL (ref 3.5–5.2)
Alkaline Phosphatase: 83 U/L (ref 39–117)
Bilirubin, Direct: 0.1 mg/dL (ref 0.0–0.3)
Total Bilirubin: 0.6 mg/dL (ref 0.2–1.2)
Total Protein: 6.7 g/dL (ref 6.0–8.3)

## 2018-11-16 LAB — TSH: TSH: 3.26 u[IU]/mL (ref 0.35–4.50)

## 2018-11-16 LAB — PSA: PSA: 0.33 ng/mL (ref 0.10–4.00)

## 2018-11-16 MED ORDER — OXYCODONE-ACETAMINOPHEN 5-325 MG PO TABS: 1.0000 | ORAL_TABLET | Freq: Four times a day (QID) | ORAL | 0 refills | Status: DC | PRN

## 2018-11-16 NOTE — Progress Notes (Signed)
Subjective:     Patient ID: Walter [name], male   DOB: 1960-10-18, 58 y.o.   MRN: [id number]  HPI   Walter [name] is seen for physical exam.  He just recently retired.  He has been very active with hiking and walking since retirement.  His weight is unchanged.  Medical problems include history of gout and adenomatous colon polyps.  He is in process of trying to reschedule 5-year repeat colonoscopy.  No recent gout flareups.  Had taken allopurinol in the past but currently just takes as needed Indocin.  Rare infrequent use of Percocet for severe arthritis pain.  Requesting 1 refill.  Past Medical History:  Diagnosis Date  . Arthritis   . Gout    History reviewed. No pertinent surgical history.  reports that he has never smoked. He has never used smokeless tobacco. He reports current alcohol use. He reports that he does not use drugs. family history includes Alcohol abuse in an other family member; Arthritis in his mother; Diabetes in his father; Heart disease (age of onset: 35) in his father; Hyperlipidemia in his father; Hypertension in his father. Allergies  Allergen Reactions  . Amoxicillin Rash  . Poison Ivy Extract Rash     Review of Systems  Constitutional: Negative for activity change, appetite change, fatigue and fever.  HENT: Negative for congestion, ear pain and trouble swallowing.   Eyes: Negative for pain and visual disturbance.  Respiratory: Negative for cough, shortness of breath and wheezing.   Cardiovascular: Negative for chest pain and palpitations.  Gastrointestinal: Negative for abdominal distention, abdominal pain, blood in stool, constipation, diarrhea, nausea, rectal pain and vomiting.  Endocrine: Negative for polydipsia and polyuria.  Genitourinary: Negative for dysuria, hematuria and testicular pain.  Musculoskeletal: Positive for arthralgias. Negative for joint swelling.  Skin: Negative for rash.  Neurological: Negative for dizziness, syncope and headaches.   Hematological: Negative for adenopathy.  Psychiatric/Behavioral: Negative for confusion and dysphoric mood.       Objective:   Physical Exam Constitutional:      General: He is not in acute distress.    Appearance: He is well-developed.  HENT:     Head: Normocephalic and atraumatic.     Right Ear: External ear normal.     Left Ear: External ear normal.  Eyes:     Conjunctiva/sclera: Conjunctivae normal.     Pupils: Pupils are equal, round, and reactive to light.  Neck:     Musculoskeletal: Normal range of motion and neck supple.     Thyroid: No thyromegaly.  Cardiovascular:     Rate and Rhythm: Normal rate and regular rhythm.     Heart sounds: Normal heart sounds. No murmur.  Pulmonary:     Effort: No respiratory distress.     Breath sounds: No wheezing or rales.  Abdominal:     General: Bowel sounds are normal. There is no distension.     Palpations: Abdomen is soft. There is no mass.     Tenderness: There is no abdominal tenderness. There is no guarding or rebound.  Musculoskeletal:     Right lower leg: No edema.     Left lower leg: No edema.  Lymphadenopathy:     Cervical: No cervical adenopathy.  Skin:    Findings: No rash.  Neurological:     Mental Status: He is alert and oriented to person, place, and time.     Cranial Nerves: No cranial nerve deficit.     Deep Tendon Reflexes: Reflexes normal.  Assessment:     Physical exam.  Patient has gout which is currently stable off medication.  Generally feels well.  Recent increased exercise    Plan:     -He will set up repeat colonoscopy -We made referral back in February to dermatology and he is still waiting to hear.  He will call them back to set up appointment for follow-up -Obtain follow-up labs including PSA -Continue Indocin as needed for gout flareups. -We agreed to refill Percocet just 20 tablets to use 1 daily sparingly for severe arthritis pains  Walter Post MD [facility] at  [facility]

## 2018-11-18 ENCOUNTER — Other Ambulatory Visit: Payer: Self-pay

## 2018-11-18 DIAGNOSIS — R748 Abnormal levels of other serum enzymes: Secondary | ICD-10-CM

## 2018-11-19 ENCOUNTER — Telehealth: Payer: Self-pay | Admitting: Family Medicine

## 2018-11-19 NOTE — Telephone Encounter (Signed)
Copied from [place] [id number]. Topic: General - Other >> Nov 19, 2018 12:28 PM Celene Kras A wrote: Reason for CRM: Pt caleld and is requesting a call back to go over lab results. Pt is also requesting a hard copy of results. Pt was advised he would have access through mychart, pt declined.  Please advise.

## 2018-11-20 NOTE — Telephone Encounter (Signed)
I have already called patient and given him his lab results on 11/18/18. I will mail a copy of his lab results to him.

## 2018-12-02 NOTE — Telephone Encounter (Signed)
See lab result note.

## 2019-01-18 ENCOUNTER — Other Ambulatory Visit: Payer: Federal, State, Local not specified - PPO

## 2019-02-22 DIAGNOSIS — D229 Melanocytic nevi, unspecified: Secondary | ICD-10-CM | POA: Diagnosis not present

## 2019-02-22 DIAGNOSIS — D225 Melanocytic nevi of trunk: Secondary | ICD-10-CM | POA: Diagnosis not present

## 2019-02-22 DIAGNOSIS — R58 Hemorrhage, not elsewhere classified: Secondary | ICD-10-CM | POA: Diagnosis not present

## 2019-02-22 DIAGNOSIS — D1801 Hemangioma of skin and subcutaneous tissue: Secondary | ICD-10-CM | POA: Diagnosis not present

## 2019-03-25 ENCOUNTER — Other Ambulatory Visit: Payer: Self-pay | Admitting: Family Medicine

## 2019-03-25 NOTE — Telephone Encounter (Signed)
Requested medication (s) are due for refill today: no  Requested medication (s) are on the active medication list:  yes  Future visit scheduled: no  Notes to clinic:  Patient states that he has not been taking medication. Please advise    Requested Prescriptions  Pending Prescriptions Disp Refills   allopurinol (ZYLOPRIM) 100 MG tablet 90 tablet 11    Sig: Take as directed daily.      Endocrinology:  Gout Agents Failed - 03/25/2019  1:25 PM      Failed - Uric Acid in normal range and within 360 days    Uric Acid, Serum  Date Value Ref Range Status  08/04/2017 10.2 (H) 4.0 - 7.8 mg/dL Final          Passed - Cr in normal range and within 360 days    Creatinine, Ser  Date Value Ref Range Status  11/16/2018 0.96 0.40 - 1.50 mg/dL Final          Passed - Valid encounter within last 12 months    Recent Outpatient Visits           4 months ago Physical exam   [facility]rap[facility], [facility] at [facility], Alinda Sierras, MD   9 months ago Hx of adenomatous colonic [place] at [facility], Alinda Sierras, MD   1 year ago Physical exam   Therapist, music at [facility], Alinda Sierras, MD   3 years ago Physical exam   Therapist, music at [facility], Alinda Sierras, MD   4 years ago Physical exam   Therapist, music at [facility], Alinda Sierras, MD

## 2019-03-25 NOTE — Telephone Encounter (Signed)
Please advise 

## 2019-03-25 NOTE — Telephone Encounter (Signed)
Medication Refill - Medication: allopurinol   Has the patient contacted their pharmacy? No. Pt states he has not been taking this medication recently. Please advise.  (Agent: If no, request that the patient contact the pharmacy for the refill.) (Agent: If yes, when and what did the pharmacy advise?)  Preferred Pharmacy (with phone number or street name):  [facility] [id number] - [place], [place] - [address] [place] [address]  [address] [place] [address]  Phone: [phone] Fax: [phone]  Not a 24 hour pharmacy; exact hours not known.     Agent: Please be advised that RX refills may take up to 3 business days. We ask that you follow-up with your pharmacy.

## 2019-03-26 MED ORDER — ALLOPURINOL 100 MG PO TABS: ORAL_TABLET | ORAL | 11 refills | Status: DC

## 2019-05-05 ENCOUNTER — Telehealth: Payer: Self-pay | Admitting: Family Medicine

## 2019-05-05 ENCOUNTER — Other Ambulatory Visit: Payer: Self-pay

## 2019-05-05 MED ORDER — INDOMETHACIN 50 MG PO CAPS: 50.0000 mg | ORAL_CAPSULE | Freq: Three times a day (TID) | ORAL | 0 refills | Status: DC | PRN

## 2019-05-05 NOTE — Telephone Encounter (Signed)
Last filled 10/2017.  OK to fill?

## 2019-05-05 NOTE — Telephone Encounter (Signed)
Refill once 

## 2019-05-05 NOTE — Telephone Encounter (Signed)
Medication has been sent to the pharmacy for the patient.  

## 2019-05-05 NOTE — Telephone Encounter (Signed)
   Patient needs a refill on Rx: indomethacin (INDOCIN) 50 MG capsule    Sent to:  [facility] [place], [place] - [address] Phone:  [phone]  Fax:  [phone]

## 2019-05-12 ENCOUNTER — Other Ambulatory Visit: Payer: Self-pay | Admitting: Family Medicine

## 2019-05-12 ENCOUNTER — Telehealth: Payer: Self-pay | Admitting: Family Medicine

## 2019-05-12 ENCOUNTER — Other Ambulatory Visit: Payer: Self-pay

## 2019-05-12 MED ORDER — INDOMETHACIN 50 MG PO CAPS: 50.0000 mg | ORAL_CAPSULE | Freq: Three times a day (TID) | ORAL | 0 refills | Status: DC | PRN

## 2019-05-12 NOTE — Telephone Encounter (Signed)
Sent call to megan to assist the pt with. Pt is calling in stating that his medication is not at the pharmacy and want to know why.  Pt is aware that it was sent.

## 2019-05-12 NOTE — Telephone Encounter (Signed)
I spoke to patient and let him know that his prescription has been sent to the pharmacy and I sent this again since patient stated that they did not receive this Rx. I called the [facility] and confirmed that they did receive the prescription request.

## 2019-08-09 ENCOUNTER — Other Ambulatory Visit: Payer: Self-pay

## 2019-08-09 ENCOUNTER — Telehealth: Payer: Self-pay | Admitting: Family Medicine

## 2019-08-09 MED ORDER — ALLOPURINOL 300 MG PO TABS: 300.0000 mg | ORAL_TABLET | Freq: Every day | ORAL | 3 refills | Status: DC

## 2019-08-09 NOTE — Telephone Encounter (Signed)
Pt call and want a refill on allopurinol (ZYLOPRIM) 100 MG . He stated that he want the 300mg  and not the 100mg .

## 2019-08-09 NOTE — Telephone Encounter (Signed)
Spoke to pt and he stated that he would like the 100 mg tablet bc its only once a day. Please advise

## 2019-08-09 NOTE — Telephone Encounter (Signed)
Done

## 2019-08-09 NOTE — Telephone Encounter (Signed)
Refill allopurinol 300 mg 1 daily #90 with 3 refill

## 2020-10-02 ENCOUNTER — Encounter: Payer: Self-pay | Admitting: Family Medicine

## 2020-10-02 ENCOUNTER — Other Ambulatory Visit: Payer: Self-pay

## 2020-10-02 ENCOUNTER — Ambulatory Visit (INDEPENDENT_AMBULATORY_CARE_PROVIDER_SITE_OTHER): Payer: Federal, State, Local not specified - PPO | Admitting: Family Medicine

## 2020-10-02 VITALS — BP 132/80 | HR 75 | Temp 98.1°F | Ht 70.0 in | Wt 190.2 lb

## 2020-10-02 DIAGNOSIS — Z125 Encounter for screening for malignant neoplasm of prostate: Secondary | ICD-10-CM | POA: Diagnosis not present

## 2020-10-02 DIAGNOSIS — R109 Unspecified abdominal pain: Secondary | ICD-10-CM | POA: Diagnosis not present

## 2020-10-02 DIAGNOSIS — Z0000 Encounter for general adult medical examination without abnormal findings: Secondary | ICD-10-CM

## 2020-10-02 DIAGNOSIS — M109 Gout, unspecified: Secondary | ICD-10-CM | POA: Diagnosis not present

## 2020-10-02 LAB — CBC WITH DIFFERENTIAL/PLATELET
Basophils Absolute: 0.1 10*3/uL (ref 0.0–0.1)
Basophils Relative: 0.9 % (ref 0.0–3.0)
Eosinophils Absolute: 0.3 10*3/uL (ref 0.0–0.7)
Eosinophils Relative: 4.6 % (ref 0.0–5.0)
HCT: 45 % (ref 39.0–52.0)
Hemoglobin: 15 g/dL (ref 13.0–17.0)
Lymphocytes Relative: 32.3 % (ref 12.0–46.0)
Lymphs Abs: 1.9 10*3/uL (ref 0.7–4.0)
MCHC: 33.4 g/dL (ref 30.0–36.0)
MCV: 91.1 fl (ref 78.0–100.0)
Monocytes Absolute: 0.5 10*3/uL (ref 0.1–1.0)
Monocytes Relative: 8 % (ref 3.0–12.0)
Neutro Abs: 3.2 10*3/uL (ref 1.4–7.7)
Neutrophils Relative %: 54.2 % (ref 43.0–77.0)
Platelets: 176 10*3/uL (ref 150.0–400.0)
RBC: 4.94 Mil/uL (ref 4.22–5.81)
RDW: 12.9 % (ref 11.5–15.5)
WBC: 5.8 10*3/uL (ref 4.0–10.5)

## 2020-10-02 LAB — URIC ACID: Uric Acid, Serum: 5.1 mg/dL (ref 4.0–7.8)

## 2020-10-02 LAB — BASIC METABOLIC PANEL
BUN: 10 mg/dL (ref 6–23)
CO2: 22 mEq/L (ref 19–32)
Calcium: 9.7 mg/dL (ref 8.4–10.5)
Chloride: 103 mEq/L (ref 96–112)
Creatinine, Ser: 0.87 mg/dL (ref 0.40–1.50)
GFR: 95.38 mL/min (ref >60.00)
Glucose, Bld: 61 mg/dL — ABNORMAL LOW (ref 70–99)
Potassium: 5.1 mEq/L (ref 3.5–5.1)
Sodium: 138 mEq/L (ref 135–145)

## 2020-10-02 LAB — HEPATIC FUNCTION PANEL
ALT: 97 U/L — ABNORMAL HIGH (ref 0–53)
AST: 78 U/L — ABNORMAL HIGH (ref 0–37)
Albumin: 5 g/dL (ref 3.5–5.2)
Alkaline Phosphatase: 79 U/L (ref 39–117)
Bilirubin, Direct: 0.1 mg/dL (ref 0.0–0.3)
Total Bilirubin: 0.6 mg/dL (ref 0.2–1.2)
Total Protein: 6.9 g/dL (ref 6.0–8.3)

## 2020-10-02 LAB — LIPID PANEL
Cholesterol: 164 mg/dL (ref 0–200)
HDL: 53.8 mg/dL (ref >39.00)
LDL Cholesterol: 92 mg/dL (ref 0–99)
NonHDL: 109.89
Total CHOL/HDL Ratio: 3
Triglycerides: 87 mg/dL (ref 0.0–149.0)
VLDL: 17.4 mg/dL (ref 0.0–40.0)

## 2020-10-02 LAB — PSA: PSA: 0.3 ng/mL (ref 0.10–4.00)

## 2020-10-02 LAB — HEMOGLOBIN A1C: Hgb A1c MFr Bld: 5.4 % (ref 4.6–6.5)

## 2020-10-02 LAB — TSH: TSH: 1.33 u[IU]/mL (ref 0.35–4.50)

## 2020-10-02 MED ORDER — ALLOPURINOL 300 MG PO TABS: 300.0000 mg | ORAL_TABLET | Freq: Every day | ORAL | 3 refills | Status: DC

## 2020-10-02 MED ORDER — OXYCODONE-ACETAMINOPHEN 5-325 MG PO TABS: 1.0000 | ORAL_TABLET | Freq: Four times a day (QID) | ORAL | 0 refills | Status: DC | PRN

## 2020-10-02 MED ORDER — SILDENAFIL CITRATE 100 MG PO TABS: 100.0000 mg | ORAL_TABLET | Freq: Every day | ORAL | 11 refills | Status: AC | PRN

## 2020-10-02 MED ORDER — INDOMETHACIN 50 MG PO CAPS: 50.0000 mg | ORAL_CAPSULE | Freq: Three times a day (TID) | ORAL | 0 refills | Status: DC | PRN

## 2020-10-02 NOTE — Progress Notes (Signed)
Established Patient Office Visit  Subjective:  Patient ID: Walter [name], male    DOB: Apr 05, 1961  Age: 60 y.o. MRN: [id number]  CC:  Chief Complaint  Patient presents with   Annual Exam    No new concerns     HPI Walter [name] presents for physical exam.  Generally doing well.  He is exercising regularly.  He had basal cell skin cancer of the face couple years ago and sees dermatology periodically for skin checks.  History of gout which is stable on allopurinol.  Past history of adenomatous colon polyps.  He is scheduled for repeat colonoscopy later this summer.  Health maintenance reviewed:  -Declines flu vaccines -Tetanus due 2026 -Declines shingles vaccine -In process of getting repeat colonoscopy -Declines COVID-vaccine  Social history-divorced 2010.  He has 2 grown sons.  Non-smoker.  Family history-significant for father having CAD age 45.  His father was a heavy smoker.  Father also had type 2 diabetes and hypertension hyperlipidemia.  He passed away last year.  His mother had some arthritis issues with osteoarthritis.  He has a sister with osteoarthritis issues as well but otherwise healthy    Past Medical History:  Diagnosis Date   Arthritis    Gout     No past surgical history on file.  Family History  Problem Relation Age of Onset   Arthritis Mother    Hyperlipidemia Father    Hypertension Father    Heart disease Father 85       CAD   Diabetes Father        type 2   Arthritis Sister    Alcohol abuse Other        grandfather   Colon cancer Neg Hx    Stomach cancer Neg Hx    Esophageal cancer Neg Hx     Social History   Socioeconomic History   Marital status: Single    Spouse name: Not on file   Number of children: 2   Years of education: Not on file   Highest education level: Not on file  Occupational History    Employer: [employer]  Tobacco Use   Smoking status: Never   Smokeless tobacco: Never  Vaping Use   Vaping Use: Never used  Substance  and Sexual Activity   Alcohol use: Yes    Comment: 3 drinks daily    Drug use: No   Sexual activity: Not on file  Other Topics Concern   Not on file  Social History Narrative   Daily caffeine   Social Determinants of Health   Financial Resource Strain: Not on file  Food Insecurity: Not on file  Transportation Needs: Not on file  Physical Activity: Not on file  Stress: Not on file  Social Connections: Not on file  Intimate Partner Violence: Not on file    Outpatient Medications Prior to Visit  Medication Sig Dispense Refill   colchicine 0.6 MG tablet Take one daily 30 tablet 1   allopurinol (ZYLOPRIM) 300 MG tablet Take 1 tablet (300 mg total) by mouth daily. 90 tablet 3   indomethacin (INDOCIN) 50 MG capsule Take 1 capsule (50 mg total) by mouth 3 (three) times daily as needed. 270 capsule 0   oxyCODONE-acetaminophen (PERCOCET/ROXICET) 5-325 MG tablet Take 1 tablet by mouth every 6 (six) hours as needed. 20 tablet 0   sildenafil (VIAGRA) 100 MG tablet Take 1 tablet (100 mg total) by mouth daily as needed for erectile dysfunction. 10 tablet 11  No facility-administered medications prior to visit.    Allergies  Allergen Reactions   Amoxicillin Rash   Poison Ivy Extract Rash    ROS Review of Systems  Constitutional:  Negative for activity change, appetite change, fatigue and fever.  HENT:  Negative for congestion, ear pain and trouble swallowing.   Eyes:  Negative for pain and visual disturbance.  Respiratory:  Negative for cough, shortness of breath and wheezing.   Cardiovascular:  Negative for chest pain and palpitations.  Gastrointestinal:  Negative for abdominal distention, abdominal pain, blood in stool, constipation, diarrhea, nausea, rectal pain and vomiting.  Endocrine: Negative for polydipsia and polyuria.  Genitourinary:  Negative for dysuria, hematuria and testicular pain.  Musculoskeletal:  Negative for arthralgias and joint swelling.  Skin:  Negative for  rash.  Neurological:  Negative for dizziness, syncope and headaches.  Hematological:  Negative for adenopathy.  Psychiatric/Behavioral:  Negative for confusion and dysphoric mood.      Objective:     BP 132/80 (BP Location: Left Arm, Patient Position: Sitting, Cuff Size: Normal)   Pulse 75   Temp 98.1 F (36.7 C) (Oral)   Ht 5\' 10"  (1.778 m)   Wt 190 lb 3.2 oz (86.3 kg)   SpO2 97%   BMI 27.29 kg/m  Wt Readings from Last 3 Encounters:  10/02/20 190 lb 3.2 oz (86.3 kg)  11/16/18 185 lb 3.2 oz (84 kg)  06/01/18 185 lb 14.4 oz (84.3 kg)     Health Maintenance Due  Topic Date Due   COVID-19 Vaccine (1) Never done   Pneumococcal Vaccine 48-55 Years old (1 - PCV) Never done   HIV Screening  Never done   COLONOSCOPY (Pts 45-43yrs Insurance coverage will need to be confirmed)  11/03/2018    There are no preventive care reminders to display for this patient.  Lab Results  Component Value Date   TSH 3.26 11/16/2018   Lab Results  Component Value Date   WBC 7.3 11/16/2018   HGB 14.1 11/16/2018   HCT 42.7 11/16/2018   MCV 90.4 11/16/2018   PLT 203.0 11/16/2018   Lab Results  Component Value Date   NA 139 11/16/2018   K 5.1 11/16/2018   CO2 27 11/16/2018   GLUCOSE 95 11/16/2018   BUN 9 11/16/2018   CREATININE 0.96 11/16/2018   BILITOT 0.6 11/16/2018   ALKPHOS 83 11/16/2018   AST 46 (H) 11/16/2018   ALT 58 (H) 11/16/2018   PROT 6.7 11/16/2018   ALBUMIN 4.7 11/16/2018   CALCIUM 10.1 11/16/2018   GFR 80.96 11/16/2018   Lab Results  Component Value Date   CHOL 158 11/16/2018   Lab Results  Component Value Date   HDL 55.90 11/16/2018   Lab Results  Component Value Date   LDLCALC 74 11/16/2018   Lab Results  Component Value Date   TRIG 139.0 11/16/2018   Lab Results  Component Value Date   CHOLHDL 3 11/16/2018   No results found for: HGBA1C    Assessment & Plan:   Physical exam.  Patient has history of gout which is stable on allopurinol.  Positive  family history of premature CAD as above.  We discussed the following health maintenance issues  -Discussed shingles vaccine but he declines -Patient in process of getting repeat colonoscopy done -Consider annual flu vaccine -Continue regular exercise habits -Check follow-up labs -We did discuss possible coronary calcium scan for further restratification and he would like to proceed that route   Meds ordered this  encounter  Medications   allopurinol (ZYLOPRIM) 300 MG tablet    Sig: Take 1 tablet (300 mg total) by mouth daily.    Dispense:  90 tablet    Refill:  3   indomethacin (INDOCIN) 50 MG capsule    Sig: Take 1 capsule (50 mg total) by mouth 3 (three) times daily as needed.    Dispense:  90 capsule    Refill:  0   sildenafil (VIAGRA) 100 MG tablet    Sig: Take 1 tablet (100 mg total) by mouth daily as needed for erectile dysfunction.    Dispense:  10 tablet    Refill:  11   oxyCODONE-acetaminophen (PERCOCET/ROXICET) 5-325 MG tablet    Sig: Take 1 tablet by mouth every 6 (six) hours as needed.    Dispense:  20 tablet    Refill:  0    Follow-up: No follow-ups on file.    Carolann Littler, MD

## 2020-10-03 ENCOUNTER — Other Ambulatory Visit: Payer: Self-pay | Admitting: Family Medicine

## 2020-10-03 DIAGNOSIS — R748 Abnormal levels of other serum enzymes: Secondary | ICD-10-CM

## 2020-10-04 ENCOUNTER — Telehealth: Payer: Self-pay | Admitting: Family Medicine

## 2020-10-04 NOTE — Telephone Encounter (Signed)
Pt call and stated he is returning your call and want a call back about his labs.

## 2020-10-04 NOTE — Telephone Encounter (Signed)
See lab results for further documentation.  

## 2020-10-05 ENCOUNTER — Encounter: Payer: Self-pay | Admitting: Family Medicine

## 2020-10-16 DIAGNOSIS — D485 Neoplasm of uncertain behavior of skin: Secondary | ICD-10-CM | POA: Diagnosis not present

## 2020-10-16 DIAGNOSIS — D225 Melanocytic nevi of trunk: Secondary | ICD-10-CM | POA: Diagnosis not present

## 2020-10-16 DIAGNOSIS — L821 Other seborrheic keratosis: Secondary | ICD-10-CM | POA: Diagnosis not present

## 2020-10-16 DIAGNOSIS — L439 Lichen planus, unspecified: Secondary | ICD-10-CM | POA: Diagnosis not present

## 2020-10-29 ENCOUNTER — Other Ambulatory Visit: Payer: Self-pay | Admitting: Family Medicine

## 2020-11-01 ENCOUNTER — Other Ambulatory Visit: Payer: Self-pay

## 2020-11-01 ENCOUNTER — Ambulatory Visit (INDEPENDENT_AMBULATORY_CARE_PROVIDER_SITE_OTHER)
Admission: RE | Admit: 2020-11-01 | Discharge: 2020-11-01 | Disposition: A | Discharge status: Home / Self Care | Payer: Self-pay | Source: Ambulatory Visit | Attending: Family Medicine | Admitting: Family Medicine

## 2020-11-01 DIAGNOSIS — Z0000 Encounter for general adult medical examination without abnormal findings: Secondary | ICD-10-CM

## 2020-11-06 ENCOUNTER — Other Ambulatory Visit: Payer: Self-pay

## 2020-11-06 ENCOUNTER — Telehealth: Payer: Federal, State, Local not specified - PPO | Admitting: Family Medicine

## 2020-11-06 DIAGNOSIS — R7401 Elevation of levels of liver transaminase levels: Secondary | ICD-10-CM | POA: Diagnosis not present

## 2020-11-06 DIAGNOSIS — R931 Abnormal findings on diagnostic imaging of heart and coronary circulation: Secondary | ICD-10-CM

## 2020-11-06 NOTE — Progress Notes (Signed)
Patient ID: Walter [name], male   DOB: 04-04-1961, 60 y.o.   MRN: [id number]  This visit type was conducted due to national recommendations for restrictions regarding the COVID-19 pandemic in an effort to limit this patient's exposure and mitigate transmission in our community.   Virtual Visit via Video Note  I connected with Walter [name] on 11/06/20 at 10:45 AM EDT by a video enabled telemedicine application and verified that I am speaking with the correct person using two identifiers.  Location patient: home Location provider:work or home office Persons participating in the virtual visit: patient, provider  I discussed the limitations of evaluation and management by telemedicine and the availability of in person appointments. The patient expressed understanding and agreed to proceed.   HPI:  Walter [name] had recent physical exam.  His lipids look fairly good with cholesterol 164, triglycerides 87, HDL 53, LDL 92 but his father had coronary disease around age 62.  His father was a smoker and had type 2 diabetes.  We discussed coronary calcium score and Walter [name] went ahead and got that done and is connected today to discuss results further  This showed total score of 260 with 86 percentile.  Most of the calcification was left circumflex artery with lesser involvement of the right coronary and left anterior descending.  He has had no chest pains and stays very active.  Non-smoker.  We discussed statin therapies in the past because of his family history but he was somewhat reluctant.  He has labs also recently revealed elevated liver transaminases.  He had been consuming more alcohol than in the past and was strongly vies to scale that back.  Our plan was to have him scale back alcohol and then repeat liver panel within a couple months.  Patient also takes allopurinol for gout and has not had any gout flareups in years.  He queries whether he could scale back the allopurinol potentially.  Last uric acid 5.1.   ROS:  See pertinent positives and negatives per HPI.  Past Medical History:  Diagnosis Date   Arthritis    Gout     No past surgical history on file.  Family History  Problem Relation Age of Onset   Arthritis Mother    Hyperlipidemia Father    Hypertension Father    Heart disease Father 25       CAD   Diabetes Father        type 2   Arthritis Sister    Alcohol abuse Other        grandfather   Colon cancer Neg Hx    Stomach cancer Neg Hx    Esophageal cancer Neg Hx     SOCIAL HX: Non-smoker.  Alcohol use as above.   Current Outpatient Medications:    allopurinol (ZYLOPRIM) 300 MG tablet, Take 1 tablet (300 mg total) by mouth daily., Disp: 90 tablet, Rfl: 3   colchicine 0.6 MG tablet, Take one daily, Disp: 30 tablet, Rfl: 1   indomethacin (INDOCIN) 50 MG capsule, TAKE 1 CAPSULE(50 MG) BY MOUTH THREE TIMES DAILY AS NEEDED, Disp: 90 capsule, Rfl: 0   oxyCODONE-acetaminophen (PERCOCET/ROXICET) 5-325 MG tablet, Take 1 tablet by mouth every 6 (six) hours as needed., Disp: 20 tablet, Rfl: 0   sildenafil (VIAGRA) 100 MG tablet, Take 1 tablet (100 mg total) by mouth daily as needed for erectile dysfunction., Disp: 10 tablet, Rfl: 11  EXAM:  VITALS per patient if applicable:  GENERAL: alert, oriented, appears well and in no acute  distress  HEENT: atraumatic, conjunttiva clear, no obvious abnormalities on inspection of external nose and ears  NECK: normal movements of the head and neck  LUNGS: on inspection no signs of respiratory distress, breathing rate appears normal, no obvious gross SOB, gasping or wheezing  CV: no obvious cyanosis  MS: moves all visible extremities without noticeable abnormality  PSYCH/NEURO: pleasant and cooperative, no obvious depression or anxiety, speech and thought processing grossly intact  ASSESSMENT AND PLAN:  Discussed the following assessment and plan:  #1 high coronary calcium score of 260/86 percentile.  Positive family history of premature  CAD. -We strongly advise starting statin but he remains reluctant particularly in view of elevated liver transaminases.  We did mention that these will need to be followed closely but he would like to think this over first. -We also discussed possible cardiology referral but declines at this time.  He does stay very active and has had no recent chest pains whatsoever  #2 elevated liver transaminases.  Possibly related to alcohol.  Strongly advised to scale back and repeat liver functions in 1 to 2 months.  If still up at that point consider further testing to rule out other etiologies.  He will also try scaling back the allopurinol to one half dose or 150 mg     I discussed the assessment and treatment plan with the patient. The patient was provided an opportunity to ask questions and all were answered. The patient agreed with the plan and demonstrated an understanding of the instructions.   The patient was advised to call back or seek an in-person evaluation if the symptoms worsen or if the condition fails to improve as anticipated.     Carolann Littler, MD

## 2020-11-14 ENCOUNTER — Ambulatory Visit (AMBULATORY_SURGERY_CENTER): Payer: Federal, State, Local not specified - PPO

## 2020-11-14 ENCOUNTER — Other Ambulatory Visit: Payer: Self-pay

## 2020-11-14 VITALS — Ht 71.0 in | Wt 193.0 lb

## 2020-11-14 DIAGNOSIS — Z1211 Encounter for screening for malignant neoplasm of colon: Secondary | ICD-10-CM

## 2020-11-14 DIAGNOSIS — Z86010 Personal history of colonic polyps: Secondary | ICD-10-CM

## 2020-11-14 MED ORDER — PLENVU 140 G PO SOLR: 1.0000 | ORAL | 0 refills | Status: DC

## 2020-11-14 NOTE — Progress Notes (Signed)
Pre visit completed via phone call; Patient verified name, DOB, and address; No egg or soy allergy known to patient  No issues with past sedation with any surgeries or procedures Patient denies ever being told they had issues or difficulty with intubation  No FH of Malignant Hyperthermia No diet pills per patient No home 02 use per patient  No blood thinners per patient  Pt denies issues with constipation  No A fib or A flutter  EMMI video via MyChart  COVID 19 guidelines implemented in [place] today with Pt and RN  Coupon given to pt in [facility] today, Code to Pharmacy and NO PA's for preps discussed with pt In [facility] today  Discussed with pt there will be an out-of-pocket cost for prep and that varies from $0 to 70 dollars  Due to the COVID-19 pandemic we are asking patients to follow certain guidelines.  Pt aware of COVID protocols and [facility] guidelines

## 2020-11-28 ENCOUNTER — Ambulatory Visit (AMBULATORY_SURGERY_CENTER): Payer: Federal, State, Local not specified - PPO | Admitting: Gastroenterology

## 2020-11-28 ENCOUNTER — Other Ambulatory Visit: Payer: Self-pay

## 2020-11-28 ENCOUNTER — Encounter: Payer: Self-pay | Admitting: Gastroenterology

## 2020-11-28 VITALS — BP 138/108 | HR 65 | Temp 98.6°F | Resp 17 | Ht 71.0 in | Wt 193.0 lb

## 2020-11-28 DIAGNOSIS — Z86010 Personal history of colonic polyps: Secondary | ICD-10-CM

## 2020-11-28 DIAGNOSIS — D125 Benign neoplasm of sigmoid colon: Secondary | ICD-10-CM | POA: Diagnosis not present

## 2020-11-28 DIAGNOSIS — Z1211 Encounter for screening for malignant neoplasm of colon: Secondary | ICD-10-CM | POA: Diagnosis not present

## 2020-11-28 MED ORDER — SODIUM CHLORIDE 0.9 % IV SOLN
500.0000 mL | Freq: Once | INTRAVENOUS | Status: DC
Start: 2020-11-28 — End: 2020-11-28

## 2020-11-28 NOTE — Progress Notes (Signed)
Vital signs checked by:[name]  The patient states no changes in medical or surgical history since pre-visit screening on 11/14/2020.

## 2020-11-28 NOTE — Op Note (Signed)
[facility] Patient Name: Walter [name] Procedure Date: 11/28/2020 11:46 AM MRN: [id number] Endoscopist: Milus Banister , MD Age: 60 Referring MD:  Date of Birth: Nov 10, 1960 Gender: Male Account #: [id number] Procedure:                Colonoscopy Indications:              High risk colon cancer surveillance: Personal                            history of colonic polyps; Colonoscopy 2015 two                            poliyps, one was a single subCM adenoma removed Medicines:                Monitored Anesthesia Care Procedure:                Pre-Anesthesia Assessment:                           - Prior to the procedure, a History and Physical                            was performed, and patient medications and                            allergies were reviewed. The patient's tolerance of                            previous anesthesia was also reviewed. The risks                            and benefits of the procedure and the sedation                            options and risks were discussed with the patient.                            All questions were answered, and informed consent                            was obtained. Prior Anticoagulants: The patient has                            taken no previous anticoagulant or antiplatelet                            agents. ASA Grade Assessment: II - A patient with                            mild systemic disease. After reviewing the risks                            and benefits, the patient was deemed in  satisfactory condition to undergo the procedure.                           After obtaining informed consent, the colonoscope                            was passed under direct vision. Throughout the                            procedure, the patient's blood pressure, pulse, and                            oxygen saturations were monitored continuously. The                            PCF-HQ190L  Colonoscope was introduced through the                            anus and advanced to the the cecum, identified by                            appendiceal orifice and ileocecal valve. The                            colonoscopy was performed without difficulty. The                            patient tolerated the procedure well. The quality                            of the bowel preparation was good. The ileocecal                            valve, appendiceal orifice, and rectum were                            photographed. Scope In: 12:02:33 PM Scope Out: 12:12:22 PM Scope Withdrawal Time: 0 hours 7 minutes 33 seconds  Total Procedure Duration: 0 hours 9 minutes 49 seconds  Findings:                 A 4 mm polyp was found in the sigmoid colon. The                            polyp was sessile. The polyp was removed with a                            cold snare. Resection and retrieval were complete.                           Multiple small and large-mouthed diverticula were                            found in the left colon.  The exam was otherwise without abnormality on                            direct and retroflexion views. Complications:            No immediate complications. Estimated blood loss:                            None. Estimated Blood Loss:     Estimated blood loss: none. Impression:               - One 4 mm polyp in the sigmoid colon, removed with                            a cold snare. Resected and retrieved.                           - Diverticulosis in the left colon.                           - The examination was otherwise normal on direct                            and retroflexion views. Recommendation:           - Patient has a contact number available for                            emergencies. The signs and symptoms of potential                            delayed complications were discussed with the                            patient.  Return to normal activities tomorrow.                            Written discharge instructions were provided to the                            patient.                           - Resume previous diet.                           - Continue present medications.                           - Await pathology results. Milus Banister, MD 11/28/2020 12:15:45 PM This report has been signed electronically.

## 2020-11-28 NOTE — Progress Notes (Signed)
HPI: This is a man with h/o polyps   ROS: complete GI ROS as described in HPI, all other review negative.  Constitutional:  No unintentional weight loss   Past Medical History:  Diagnosis Date   Basal cell carcinoma (BCC) of face 2014   LEFT eye-   GERD (gastroesophageal reflux disease)    with certain foods   Gout     Past Surgical History:  Procedure Laterality Date   COLONOSCOPY  2015   DJ-movi(exc)-tics/TA's   POLYPECTOMY  2015   TA's   UPPER GASTROINTESTINAL ENDOSCOPY  2015   h.pylori/gastritis   WISDOM TOOTH EXTRACTION      Current Outpatient Medications  Medication Sig Dispense Refill   sildenafil (VIAGRA) 100 MG tablet Take 1 tablet (100 mg total) by mouth daily as needed for erectile dysfunction. 10 tablet 11   allopurinol (ZYLOPRIM) 300 MG tablet Take 1 tablet (300 mg total) by mouth daily. 90 tablet 3   indomethacin (INDOCIN) 50 MG capsule TAKE 1 CAPSULE(50 MG) BY MOUTH THREE TIMES DAILY AS NEEDED 90 capsule 0   oxyCODONE-acetaminophen (PERCOCET/ROXICET) 5-325 MG tablet Take 1 tablet by mouth every 6 (six) hours as needed. 20 tablet 0   Current Facility-Administered Medications  Medication Dose Route Frequency Provider Last Rate Last Admin   0.Walter %  sodium chloride infusion  500 mL Intravenous Once Milus Banister, MD        Allergies as of 11/28/2020 - Review Complete 11/28/2020  Allergen Reaction Noted   Amoxicillin Rash 03/31/2016   Poison ivy extract Rash 06/01/2018    Family History  Problem Relation Age of Onset   Arthritis Mother    Hyperlipidemia Father    Hypertension Father    Heart disease Father 59       CAD   Diabetes Father        type 2   Arthritis Sister    Alcohol abuse Other        grandfather   Colon cancer Neg Hx    Stomach cancer Neg Hx    Esophageal cancer Neg Hx    Colon polyps Neg Hx    Rectal cancer Neg Hx     Social History   Socioeconomic History   Marital status: Single    Spouse name: Not on file   Number  of children: 2   Years of education: Not on file   Highest education level: Not on file  Occupational History    Employer: [employer]  Tobacco Use   Smoking status: Never   Smokeless tobacco: Never  Vaping Use   Vaping Use: Never used  Substance and Sexual Activity   Alcohol use: Yes    Alcohol/week: 14.0 standard drinks    Types: 14 Standard drinks or equivalent per week    Comment: 3 drinks daily    Drug use: No   Sexual activity: Not on file  Other Topics Concern   Not on file  Social History Narrative   Daily caffeine   Social Determinants of Health   Financial Resource Strain: Not on file  Food Insecurity: Not on file  Transportation Needs: Not on file  Physical Activity: Not on file  Stress: Not on file  Social Connections: Not on file  Intimate Partner Violence: Not on file     Physical Exam: BP (!) 175/108   Pulse 85   Temp 98.6 F (37 C)   Resp 11   Ht '5\' 11"'$  (1.803 m)   Wt 193 lb (87.5  kg)   SpO2 99%   BMI 26.92 kg/m  Constitutional: generally well-appearing Psychiatric: alert and oriented x3 Lungs: CTA bilaterally Heart: no MCR  Assessment and plan: 60 y.o. Walter [name] with h/o polyps   Colonoscopy today  Care is appropriate for the ambulatory setting.  Owens Loffler, MD [facility] 11/28/2020, 11:50 AM

## 2020-11-28 NOTE — Progress Notes (Signed)
To PACU, VSS. Report to [email or web address] 

## 2020-11-28 NOTE — Patient Instructions (Signed)
FOLLOW UP WITH DR Elease Hashimoto FOR BLOOD PRESSURE CHECKS.  HANDOUTS GIVEN for diverticulosis and polyps.  YOU HAD AN ENDOSCOPIC PROCEDURE TODAY AT [facility]:   Refer to the procedure report that was given to you for any specific questions about what was found during the examination.  If the procedure report does not answer your questions, please call your gastroenterologist to clarify.  If you requested that your care partner not be given the details of your procedure findings, then the procedure report has been included in a sealed envelope for you to review at your convenience later.  YOU SHOULD EXPECT: Some feelings of bloating in the abdomen. Passage of more gas than usual.  Walking can help get rid of the air that was put into your GI tract during the procedure and reduce the bloating. If you had a lower endoscopy (such as a colonoscopy or flexible sigmoidoscopy) you may notice spotting of blood in your stool or on the toilet paper. If you underwent a bowel prep for your procedure, you may not have a normal bowel movement for a few days.  Please Note:  You might notice some irritation and congestion in your nose or some drainage.  This is from the oxygen used during your procedure.  There is no need for concern and it should clear up in a day or so.  SYMPTOMS TO REPORT IMMEDIATELY:  Following lower endoscopy (colonoscopy or flexible sigmoidoscopy):  Excessive amounts of blood in the stool  Significant tenderness or worsening of abdominal pains  Swelling of the abdomen that is new, acute  Fever of 100F or higher  For urgent or emergent issues, a gastroenterologist can be reached at any hour by calling [phone]. Do not use MyChart messaging for urgent concerns.    DIET:  We do recommend a small meal at first, but then you may proceed to your regular diet.  Drink plenty of fluids but you should avoid alcoholic beverages for 24 hours.  ACTIVITY:  You should plan to  take it easy for the rest of today and you should NOT DRIVE or use heavy machinery until tomorrow (because of the sedation medicines used during the test).    FOLLOW UP: Our staff will call the number listed on your records 48-72 hours following your procedure to check on you and address any questions or concerns that you may have regarding the information given to you following your procedure. If we do not reach you, we will leave a message.  We will attempt to reach you two times.  During this call, we will ask if you have developed any symptoms of COVID 19. If you develop any symptoms (ie: fever, flu-like symptoms, shortness of breath, cough etc.) before then, please call [phone].  If you test positive for Covid 19 in the 2 weeks post procedure, please call and report this information to [place].    If any biopsies were taken you will be contacted by phone or by letter within the next 1-3 weeks.  Please call us at [phone] if you have not heard about the biopsies in 3 weeks.    SIGNATURES/CONFIDENTIALITY: You and/or your care partner have signed paperwork which will be entered into your electronic medical record.  These signatures attest to the fact that that the information above on your After Visit Summary has been reviewed and is understood.  Full responsibility of the confidentiality of this discharge information lies with you and/or your care-partner.

## 2020-11-28 NOTE — Progress Notes (Signed)
Called to room to assist during endoscopic procedure.  Patient ID and intended procedure confirmed with present staff. Received instructions for my participation in the procedure from the performing physician.  

## 2020-11-30 ENCOUNTER — Telehealth: Payer: Self-pay

## 2020-11-30 NOTE — Telephone Encounter (Signed)
  Follow up Call-  Call back number 11/28/2020  Post procedure Call Back phone  # [phone]  Permission to leave phone message Yes  Some recent data might be hidden     Patient questions:  Do you have a fever, pain , or abdominal swelling? No. Pain Score  0 *  Have you tolerated food without any problems? Yes.    Have you been able to return to your normal activities? Yes.    Do you have any questions about your discharge instructions: Diet   No. Medications  No. Follow up visit  No.  Do you have questions or concerns about your Care? No.  Actions: * If pain score is 4 or above: No action needed, pain <4.  Have you developed a fever since your procedure? no  2.   Have you had an respiratory symptoms (SOB or cough) since your procedure? no  3.   Have you tested positive for COVID 19 since your procedure no  4.   Have you had any family members/close contacts diagnosed with the COVID 19 since your procedure?  no   If yes to any of these questions please route to Joylene John, RN and Joella Prince, RN

## 2020-12-01 ENCOUNTER — Encounter: Payer: Self-pay | Admitting: Gastroenterology

## 2021-01-24 DIAGNOSIS — J069 Acute upper respiratory infection, unspecified: Secondary | ICD-10-CM | POA: Diagnosis not present

## 2021-01-24 DIAGNOSIS — R030 Elevated blood-pressure reading, without diagnosis of hypertension: Secondary | ICD-10-CM | POA: Diagnosis not present

## 2021-06-26 ENCOUNTER — Other Ambulatory Visit: Payer: Self-pay | Admitting: Family Medicine

## 2021-06-26 ENCOUNTER — Ambulatory Visit: Payer: Federal, State, Local not specified - PPO | Admitting: Family Medicine

## 2021-06-26 ENCOUNTER — Encounter: Payer: Self-pay | Admitting: Family Medicine

## 2021-06-26 VITALS — BP 190/96 | HR 85 | Temp 97.8°F | Ht 71.0 in | Wt 196.8 lb

## 2021-06-26 DIAGNOSIS — I10 Essential (primary) hypertension: Secondary | ICD-10-CM

## 2021-06-26 MED ORDER — LOSARTAN POTASSIUM 100 MG PO TABS: 100.0000 mg | ORAL_TABLET | Freq: Every day | ORAL | 1 refills | Status: DC

## 2021-06-26 NOTE — Progress Notes (Signed)
? ?Established Patient Office Visit ? ?Subjective:  ?Patient ID: Walter [name], male    DOB: 09/08/1960  Age: 61 y.o. MRN: [id number] ? ?CC:  ?Chief Complaint  ?Patient presents with  ? Hypertension  ? ? ?HPI ?Walter [name] presents with a few months of severe blood pressure elevations by readings he is obtained at local pharmacy.  He had several readings up around 841 systolic and 660 diastolic.  No history of hypertension.  Generally does not feel well in terms of some malaise.  No headaches.  No chest pains.  No peripheral edema.  Does apparently eat a fair amount of sodium.  Also currently drinking about 6 beers per day.  He plans to go back.  Does have strong family history of hypertension.  His father had hypertension and heart disease issues. ? ?Past Medical History:  ?Diagnosis Date  ? Basal cell carcinoma (BCC) of face 2014  ? LEFT eye-  ? GERD (gastroesophageal reflux disease)   ? with certain foods  ? Gout   ? ? ?Past Surgical History:  ?Procedure Laterality Date  ? COLONOSCOPY  2015  ? DJ-movi(exc)-tics/TA's  ? POLYPECTOMY  2015  ? TA's  ? UPPER GASTROINTESTINAL ENDOSCOPY  2015  ? h.pylori/gastritis  ? WISDOM TOOTH EXTRACTION    ? ? ?Family History  ?Problem Relation Age of Onset  ? Arthritis Mother   ? Hyperlipidemia Father   ? Hypertension Father   ? Heart disease Father 91  ?     CAD  ? Diabetes Father   ?     type 2  ? Arthritis Sister   ? Alcohol abuse Other   ?     grandfather  ? Colon cancer Neg Hx   ? Stomach cancer Neg Hx   ? Esophageal cancer Neg Hx   ? Colon polyps Neg Hx   ? Rectal cancer Neg Hx   ? ? ?Social History  ? ?Socioeconomic History  ? Marital status: Single  ?  Spouse name: Not on file  ? Number of children: 2  ? Years of education: Not on file  ? Highest education level: Not on file  ?Occupational History  ?  Employer: [employer]  ?Tobacco Use  ? Smoking status: Never  ? Smokeless tobacco: Never  ?Vaping Use  ? Vaping Use: Never used  ?Substance and Sexual Activity  ? Alcohol use: Yes  ?   Alcohol/week: 14.0 standard drinks  ?  Types: 14 Standard drinks or equivalent per week  ?  Comment: 3 drinks daily   ? Drug use: No  ? Sexual activity: Not on file  ?Other Topics Concern  ? Not on file  ?Social History Narrative  ? Daily caffeine  ? ?Social Determinants of Health  ? ?Financial Resource Strain: Not on file  ?Food Insecurity: Not on file  ?Transportation Needs: Not on file  ?Physical Activity: Not on file  ?Stress: Not on file  ?Social Connections: Not on file  ?Intimate Partner Violence: Not on file  ? ? ?Outpatient Medications Prior to Visit  ?Medication Sig Dispense Refill  ? allopurinol (ZYLOPRIM) 300 MG tablet Take 1 tablet (300 mg total) by mouth daily. 90 tablet 3  ? indomethacin (INDOCIN) 50 MG capsule TAKE 1 CAPSULE(50 MG) BY MOUTH THREE TIMES DAILY AS NEEDED 90 capsule 0  ? oxyCODONE-acetaminophen (PERCOCET/ROXICET) 5-325 MG tablet Take 1 tablet by mouth every 6 (six) hours as needed. 20 tablet 0  ? sildenafil (VIAGRA) 100 MG tablet Take 1 tablet (  100 mg total) by mouth daily as needed for erectile dysfunction. 10 tablet 11  ? ?No facility-administered medications prior to visit.  ? ? ?Allergies  ?Allergen Reactions  ? Amoxicillin Rash  ? Poison Ivy Extract Rash  ? ? ?ROS ?Review of Systems  ?Constitutional:  Positive for fatigue.  ?Eyes:  Negative for visual disturbance.  ?Respiratory:  Negative for cough, chest tightness and shortness of breath.   ?Cardiovascular:  Negative for chest pain, palpitations and leg swelling.  ?Neurological:  Negative for dizziness, syncope, weakness, light-headedness and headaches.  ? ?  ?Objective:  ?  ?Physical Exam ?Vitals reviewed.  ?Constitutional:   ?   Appearance: Normal appearance.  ?Cardiovascular:  ?   Rate and Rhythm: Normal rate and regular rhythm.  ?Pulmonary:  ?   Effort: Pulmonary effort is normal.  ?   Breath sounds: Normal breath sounds.  ?Musculoskeletal:  ?   Right lower leg: No edema.  ?   Left lower leg: No edema.  ?Neurological:  ?    Mental Status: He is alert.  ? ? ?BP (!) 190/96 (BP Location: Left Arm, Patient Position: Sitting, Cuff Size: Normal)   Pulse 85   Temp 97.8 ?F (36.6 ?C) (Oral)   Ht '5\' 11"'$  (1.803 m)   Wt 196 lb 12.8 oz (89.3 kg)   SpO2 97%   BMI 27.45 kg/m?  ?Wt Readings from Last 3 Encounters:  ?06/26/21 196 lb 12.8 oz (89.3 kg)  ?11/28/20 193 lb (87.5 kg)  ?11/14/20 193 lb (87.5 kg)  ? ? ? ?Health Maintenance Due  ?Topic Date Due  ? COVID-19 Vaccine (1) Never done  ? HIV Screening  Never done  ? Zoster Vaccines- Shingrix (1 of 2) Never done  ? ? ?There are no preventive care reminders to display for this patient. ? ?Lab Results  ?Component Value Date  ? TSH 1.33 10/02/2020  ? ?Lab Results  ?Component Value Date  ? WBC 5.8 10/02/2020  ? HGB 15.0 10/02/2020  ? HCT 45.0 10/02/2020  ? MCV 91.1 10/02/2020  ? PLT 176.0 10/02/2020  ? ?Lab Results  ?Component Value Date  ? NA 138 10/02/2020  ? K 5.1 10/02/2020  ? CO2 22 10/02/2020  ? GLUCOSE 61 (L) 10/02/2020  ? BUN 10 10/02/2020  ? CREATININE 0.87 10/02/2020  ? BILITOT 0.6 10/02/2020  ? ALKPHOS 79 10/02/2020  ? AST 78 (H) 10/02/2020  ? ALT 97 (H) 10/02/2020  ? PROT 6.9 10/02/2020  ? ALBUMIN 5.0 10/02/2020  ? CALCIUM 9.7 10/02/2020  ? GFR 95.38 10/02/2020  ? ?Lab Results  ?Component Value Date  ? CHOL 164 10/02/2020  ? ?Lab Results  ?Component Value Date  ? HDL 53.80 10/02/2020  ? ?Lab Results  ?Component Value Date  ? Davis 92 10/02/2020  ? ?Lab Results  ?Component Value Date  ? TRIG 87.0 10/02/2020  ? ?Lab Results  ?Component Value Date  ? CHOLHDL 3 10/02/2020  ? ?Lab Results  ?Component Value Date  ? HGBA1C 5.4 10/02/2020  ? ? ?  ?Assessment & Plan:  ? ?Problem List Items Addressed This Visit   ?None ?Visit Diagnoses   ? ? Essential hypertension    -  Primary  ? Relevant Medications  ? losartan (COZAAR) 100 MG tablet  ? ?  ?Patient has severe hypertension with repeat reading left arm seated after rest 190/102.  Several similar readings at pharmacy over the past several months.   Excessive alcohol use currently with about 6 beers per day which  could be contributing ? ?-Handout on DASH diet given ?-Scale back alcohol use ?-Try to keep sodium intake less than 2500 mg daily ?-Start losartan 100 mg daily ?-Probably try to avoid thiazide with his history of gout ?-May need to add calcium channel blocker at follow-up ?-Set up office follow-up in 3 to 4 weeks ? ?Meds ordered this encounter  ?Medications  ? losartan (COZAAR) 100 MG tablet  ?  Sig: Take 1 tablet (100 mg total) by mouth daily.  ?  Dispense:  30 tablet  ?  Refill:  1  ? ? ?Follow-up: Return in about 4 weeks (around 07/24/2021).  ? ? ?Carolann Littler, MD ?

## 2021-06-26 NOTE — Patient Instructions (Signed)
Try to scale back the alcohol use.   ?

## 2021-07-23 ENCOUNTER — Other Ambulatory Visit: Payer: Self-pay | Admitting: Family Medicine

## 2021-10-19 ENCOUNTER — Other Ambulatory Visit: Payer: Self-pay | Admitting: Family Medicine

## 2021-11-30 ENCOUNTER — Other Ambulatory Visit: Payer: Self-pay | Admitting: Family Medicine

## 2022-01-19 ENCOUNTER — Other Ambulatory Visit: Payer: Self-pay | Admitting: Family Medicine

## 2022-01-30 ENCOUNTER — Telehealth: Payer: Self-pay | Admitting: Family Medicine

## 2022-01-30 ENCOUNTER — Other Ambulatory Visit: Payer: Self-pay

## 2022-01-30 MED ORDER — ALLOPURINOL 300 MG PO TABS: ORAL_TABLET | ORAL | 0 refills | Status: DC

## 2022-01-30 MED ORDER — LOSARTAN POTASSIUM 100 MG PO TABS: ORAL_TABLET | ORAL | 0 refills | Status: DC

## 2022-01-30 NOTE — Telephone Encounter (Signed)
Refill allopurinol (ZYLOPRIM) 300 MG tablet, losartan (COZAAR) 100 MG tablet

## 2022-01-30 NOTE — Telephone Encounter (Signed)
Refill sent to [facility] in [place].  

## 2022-04-28 ENCOUNTER — Other Ambulatory Visit: Payer: Self-pay | Admitting: Family Medicine

## 2022-07-26 ENCOUNTER — Other Ambulatory Visit: Payer: Self-pay | Admitting: Family Medicine

## 2022-08-12 DIAGNOSIS — L570 Actinic keratosis: Secondary | ICD-10-CM | POA: Diagnosis not present

## 2022-08-12 DIAGNOSIS — D2372 Other benign neoplasm of skin of left lower limb, including hip: Secondary | ICD-10-CM | POA: Diagnosis not present

## 2022-08-12 DIAGNOSIS — L821 Other seborrheic keratosis: Secondary | ICD-10-CM | POA: Diagnosis not present

## 2022-08-12 DIAGNOSIS — L814 Other melanin hyperpigmentation: Secondary | ICD-10-CM | POA: Diagnosis not present

## 2022-08-12 DIAGNOSIS — D1801 Hemangioma of skin and subcutaneous tissue: Secondary | ICD-10-CM | POA: Diagnosis not present

## 2022-09-03 ENCOUNTER — Other Ambulatory Visit: Payer: Self-pay | Admitting: Family Medicine

## 2022-10-07 ENCOUNTER — Other Ambulatory Visit: Payer: Self-pay | Admitting: Family Medicine

## 2022-10-07 NOTE — Telephone Encounter (Signed)
Pt called to say he is completely out of these medications:  allopurinol (ZYLOPRIM) 300 MG tablet  losartan (COZAAR) 100 MG tablet  Please send refills, as soon as you can, to:  [id number] - [place], [place] - [address] [place] [address] Phone: [phone]  Fax: [phone]

## 2022-10-07 NOTE — Telephone Encounter (Signed)
I spoke with the patient and informed him he needs to schedule a visit before next refill. Patient verbalized understanding and will give [place] a call back to schedule

## 2022-10-08 NOTE — Telephone Encounter (Signed)
Noted  

## 2022-10-08 NOTE — Telephone Encounter (Signed)
Pt has made an appt for cpe on 11-15-2022

## 2022-11-05 ENCOUNTER — Other Ambulatory Visit: Payer: Self-pay | Admitting: Family Medicine

## 2022-11-15 ENCOUNTER — Encounter: Payer: Self-pay | Admitting: Family Medicine

## 2022-11-15 ENCOUNTER — Ambulatory Visit: Payer: Federal, State, Local not specified - PPO | Admitting: Family Medicine

## 2022-11-15 VITALS — BP 130/90 | HR 71 | Temp 98.1°F | Ht 71.5 in | Wt 191.0 lb

## 2022-11-15 DIAGNOSIS — R748 Abnormal levels of other serum enzymes: Secondary | ICD-10-CM | POA: Diagnosis not present

## 2022-11-15 DIAGNOSIS — T560X1D Toxic effect of lead and its compounds, accidental (unintentional), subsequent encounter: Secondary | ICD-10-CM

## 2022-11-15 DIAGNOSIS — M1A10X0 Lead-induced chronic gout, unspecified site, without tophus (tophi): Secondary | ICD-10-CM | POA: Diagnosis not present

## 2022-11-15 DIAGNOSIS — Z13220 Encounter for screening for lipoid disorders: Secondary | ICD-10-CM | POA: Diagnosis not present

## 2022-11-15 DIAGNOSIS — Z131 Encounter for screening for diabetes mellitus: Secondary | ICD-10-CM

## 2022-11-15 DIAGNOSIS — Z0000 Encounter for general adult medical examination without abnormal findings: Secondary | ICD-10-CM

## 2022-11-15 DIAGNOSIS — Z125 Encounter for screening for malignant neoplasm of prostate: Secondary | ICD-10-CM | POA: Diagnosis not present

## 2022-11-15 DIAGNOSIS — Z23 Encounter for immunization: Secondary | ICD-10-CM | POA: Diagnosis not present

## 2022-11-15 LAB — HEPATIC FUNCTION PANEL
ALT: 117 U/L — ABNORMAL HIGH (ref 0–53)
AST: 106 U/L — ABNORMAL HIGH (ref 0–37)
Albumin: 4.8 g/dL (ref 3.5–5.2)
Alkaline Phosphatase: 93 U/L (ref 39–117)
Bilirubin, Direct: 0.4 mg/dL — ABNORMAL HIGH (ref 0.0–0.3)
Total Bilirubin: 1.5 mg/dL — ABNORMAL HIGH (ref 0.2–1.2)
Total Protein: 6.9 g/dL (ref 6.0–8.3)

## 2022-11-15 LAB — LIPID PANEL
Cholesterol: 163 mg/dL (ref 0–200)
HDL: 53.3 mg/dL (ref >39.00)
LDL Cholesterol: 83 mg/dL (ref 0–99)
NonHDL: 110.19
Total CHOL/HDL Ratio: 3
Triglycerides: 136 mg/dL (ref 0.0–149.0)
VLDL: 27.2 mg/dL (ref 0.0–40.0)

## 2022-11-15 LAB — BASIC METABOLIC PANEL
BUN: 8 mg/dL (ref 6–23)
CO2: 27 mEq/L (ref 19–32)
Calcium: 10.4 mg/dL (ref 8.4–10.5)
Chloride: 100 mEq/L (ref 96–112)
Creatinine, Ser: 0.8 mg/dL (ref 0.40–1.50)
GFR: 96.38 mL/min (ref >60.00)
Glucose, Bld: 107 mg/dL — ABNORMAL HIGH (ref 70–99)
Potassium: 5.1 mEq/L (ref 3.5–5.1)
Sodium: 135 mEq/L (ref 135–145)

## 2022-11-15 LAB — HEMOGLOBIN A1C: Hgb A1c MFr Bld: 5.4 % (ref 4.6–6.5)

## 2022-11-15 LAB — CBC WITH DIFFERENTIAL/PLATELET
Basophils Absolute: 0 10*3/uL (ref 0.0–0.1)
Basophils Relative: 0.6 % (ref 0.0–3.0)
Eosinophils Absolute: 0.6 10*3/uL (ref 0.0–0.7)
Eosinophils Relative: 9.5 % — ABNORMAL HIGH (ref 0.0–5.0)
HCT: 43.8 % (ref 39.0–52.0)
Hemoglobin: 14.1 g/dL (ref 13.0–17.0)
Lymphocytes Relative: 28.1 % (ref 12.0–46.0)
Lymphs Abs: 1.8 10*3/uL (ref 0.7–4.0)
MCHC: 32.2 g/dL (ref 30.0–36.0)
MCV: 97.1 fl (ref 78.0–100.0)
Monocytes Absolute: 0.7 10*3/uL (ref 0.1–1.0)
Monocytes Relative: 10.6 % (ref 3.0–12.0)
Neutro Abs: 3.2 10*3/uL (ref 1.4–7.7)
Neutrophils Relative %: 51.2 % (ref 43.0–77.0)
Platelets: 141 10*3/uL — ABNORMAL LOW (ref 150.0–400.0)
RBC: 4.51 Mil/uL (ref 4.22–5.81)
RDW: 13.3 % (ref 11.5–15.5)
WBC: 6.3 10*3/uL (ref 4.0–10.5)

## 2022-11-15 LAB — URIC ACID: Uric Acid, Serum: 3.7 mg/dL — ABNORMAL LOW (ref 4.0–7.8)

## 2022-11-15 LAB — PSA: PSA: 0.56 ng/mL (ref 0.10–4.00)

## 2022-11-15 MED ORDER — ALLOPURINOL 300 MG PO TABS: ORAL_TABLET | ORAL | 3 refills | Status: DC

## 2022-11-15 MED ORDER — LOSARTAN POTASSIUM 100 MG PO TABS: ORAL_TABLET | ORAL | 3 refills | Status: DC

## 2022-11-15 NOTE — Patient Instructions (Signed)
Naltrexone is the drug we discussed for alcohol abstinence

## 2022-11-15 NOTE — Progress Notes (Signed)
Established Patient Office Visit  Subjective   Patient ID: Walter [name], male    DOB: 26-Sep-1960  Age: 62 y.o. MRN: [id number]  No chief complaint on file.   HPI   Walter [name] is here for physical exam.  He has history of gout which is treated with allopurinol with no recent flareups.  He has hypertension treated with losartan 100 mg daily.  History of elevated coronary calcium score.  Currently not on statin.  Does have history of adenomatous colon polyps.  He states he has a cousin that was diagnosed recently with alcoholic cirrhosis and he has concerns himself in view of prior elevated liver transaminases.  He also has had some curvature of penis with erection which makes intercourse difficult.  He is fairly certain he has Peyronie's disease which apparently someone else in his family has as well.  Not yet ready to see urologist.  Health maintenance reviewed  -Due for repeat colonoscopy 2029 -Tetanus due 2026 -Prior hepatitis C screen negative -No history of Shingrix vaccine but declines  Social history-divorced 2010.  Does have a steady girlfriend.  2 grown sons.  Non-smoker.  Fairly heavy alcohol use.  Usually drinks about 5 days/week sometimes 5-6 beers per day and sometimes in additional bourbon  Family history-significant for father having CAD age 63.  His father was a heavy smoker.  Father also had type 2 diabetes and hypertension, hyperlipidemia.  He passed away last year.  His mother had some issues with osteoarthritis.  He has a sister with osteoarthritis issues as well but otherwise healthy  Past Medical History:  Diagnosis Date   Basal cell carcinoma (BCC) of face 2014   LEFT eye-   GERD (gastroesophageal reflux disease)    with certain foods   Gout    Past Surgical History:  Procedure Laterality Date   COLONOSCOPY  2015   DJ-movi(exc)-tics/TA's   POLYPECTOMY  2015   TA's   UPPER GASTROINTESTINAL ENDOSCOPY  2015   h.pylori/gastritis   WISDOM TOOTH EXTRACTION       reports that he has never smoked. He has never used smokeless tobacco. He reports current alcohol use of about 14.0 standard drinks of alcohol per week. He reports that he does not use drugs. family history includes Alcohol abuse in an other family member; Arthritis in his mother and sister; Diabetes in his father; Heart disease (age of onset: 32) in his father; Hyperlipidemia in his father; Hypertension in his father. Allergies  Allergen Reactions   Amoxicillin Rash   Poison Ivy Extract Rash       Review of Systems  Constitutional:  Negative for chills, fever, malaise/fatigue and weight loss.  HENT:  Negative for hearing loss.   Eyes:  Negative for blurred vision and double vision.  Respiratory:  Negative for cough and shortness of breath.   Cardiovascular:  Negative for chest pain, palpitations and leg swelling.  Gastrointestinal:  Negative for abdominal pain, blood in stool, constipation and diarrhea.  Genitourinary:  Negative for dysuria.  Skin:  Negative for rash.  Neurological:  Negative for dizziness, speech change, seizures, loss of consciousness and headaches.  Psychiatric/Behavioral:  Negative for depression.       Objective:     BP (!) 130/90 (BP Location: Left Arm, Patient Position: Sitting, Cuff Size: Normal)   Pulse 71   Temp 98.1 F (36.7 C) (Oral)   Ht 5' 11.5" (1.816 m)   Wt 191 lb (86.6 kg)   SpO2 99%   BMI 26.27  kg/m  BP Readings from Last 3 Encounters:  11/15/22 (!) 130/90  06/26/21 (!) 190/96  11/28/20 (!) 138/108   Wt Readings from Last 3 Encounters:  11/15/22 191 lb (86.6 kg)  06/26/21 196 lb 12.8 oz (89.3 kg)  11/28/20 193 lb (87.5 kg)      Physical Exam Vitals reviewed.  Constitutional:      General: He is not in acute distress.    Appearance: He is well-developed.  HENT:     Head: Normocephalic and atraumatic.     Right Ear: External ear normal.     Left Ear: External ear normal.  Eyes:     Conjunctiva/sclera: Conjunctivae normal.      Pupils: Pupils are equal, round, and reactive to light.  Neck:     Thyroid: No thyromegaly.  Cardiovascular:     Rate and Rhythm: Normal rate and regular rhythm.     Heart sounds: Normal heart sounds. No murmur heard. Pulmonary:     Effort: No respiratory distress.     Breath sounds: No wheezing or rales.  Abdominal:     General: Bowel sounds are normal. There is no distension.     Palpations: Abdomen is soft. There is no mass.     Tenderness: There is no abdominal tenderness. There is no guarding or rebound.  Musculoskeletal:     Cervical back: Normal range of motion and neck supple.     Right lower leg: No edema.     Left lower leg: No edema.  Lymphadenopathy:     Cervical: No cervical adenopathy.  Skin:    Findings: No rash.  Neurological:     Mental Status: He is alert and oriented to person, place, and time.     Cranial Nerves: No cranial nerve deficit.      No results found for any visits on 11/15/22.  Last CBC Lab Results  Component Value Date   WBC 5.8 10/02/2020   HGB 15.0 10/02/2020   HCT 45.0 10/02/2020   MCV 91.1 10/02/2020   RDW 12.9 10/02/2020   PLT 176.0 10/02/2020   Last metabolic panel Lab Results  Component Value Date   GLUCOSE 61 (L) 10/02/2020   NA 138 10/02/2020   K 5.1 10/02/2020   CL 103 10/02/2020   CO2 22 10/02/2020   BUN 10 10/02/2020   CREATININE 0.87 10/02/2020   GFR 95.38 10/02/2020   CALCIUM 9.7 10/02/2020   PROT 6.9 10/02/2020   ALBUMIN 5.0 10/02/2020   BILITOT 0.6 10/02/2020   ALKPHOS 79 10/02/2020   AST 78 (H) 10/02/2020   ALT 97 (H) 10/02/2020   Last lipids Lab Results  Component Value Date   CHOL 164 10/02/2020   HDL 53.80 10/02/2020   LDLCALC 92 10/02/2020   TRIG 87.0 10/02/2020   CHOLHDL 3 10/02/2020   Last hemoglobin A1c Lab Results  Component Value Date   HGBA1C 5.4 10/02/2020   Last thyroid functions Lab Results  Component Value Date   TSH 1.33 10/02/2020      The 10-year ASCVD risk score  (Arnett DK, et al., 2019) is: 8.2%    Assessment & Plan:   Physical exam.  He has history of hypertension and gout which have generally been fairly well-controlled with losartan and allopurinol.  He has concerns for possible Peyronie's disease based on curvature which is painful with erection.  History of elevated liver transaminases very likely related to alcohol.  -Discussed Shingrix vaccine and he declines -Obtain follow-up screening labs -Consider getting back  on statin based on previous elevated coronary calcium score but will depend on his liver function -Strongly advised alcohol abstinence.  He has had some difficulty with binging in the past.  We did discuss possible medication options for maintaining abstinence such as naltrexone. -Consider urology referral for his painful curvature of penis and suspected Peyronie's disease.  He will let us know when/if he is ready for referral No follow-ups on file.    Evelena Peat, MD

## 2022-11-18 NOTE — Addendum Note (Signed)
Addended by: Sallee Lange A on: 11/18/2022 11:00 AM   Modules accepted: Orders

## 2022-12-04 ENCOUNTER — Other Ambulatory Visit: Payer: Self-pay | Admitting: Family Medicine

## 2023-01-20 ENCOUNTER — Other Ambulatory Visit: Payer: Federal, State, Local not specified - PPO

## 2023-01-20 IMAGING — CT CT CARDIAC CORONARY ARTERY CALCIUM SCORE
3 series · 14 of 20 positions shown, 15 images · non-contrast
Comparison: None.
COMPARISON: None.

Addendum:
EXAM:
OVER-READ INTERPRETATION  CT CHEST

The following report is an over-read performed by radiologist Dr.
Blade Aujla [REDACTED] on 11/01/2020. This over-read
does not include interpretation of cardiac or coronary anatomy or
pathology. The calcium score interpretation by the cardiologist is
attached.
TECHNIQUE: A gated, non-contrast computed tomography scan of the heart was
performed using 3mm slice thickness. Axial images were analyzed on a
dedicated workstation. Calcium scoring of the coronary arteries was
performed using the Agatston method.

[Series 2: casc 3.0 bv41 2 bestdiast 66 % · axial · 0.46mm/px · z∈[-244,-169]mm · 4 of 43 slices shown, 5 images]
[im 9/43  vessel]
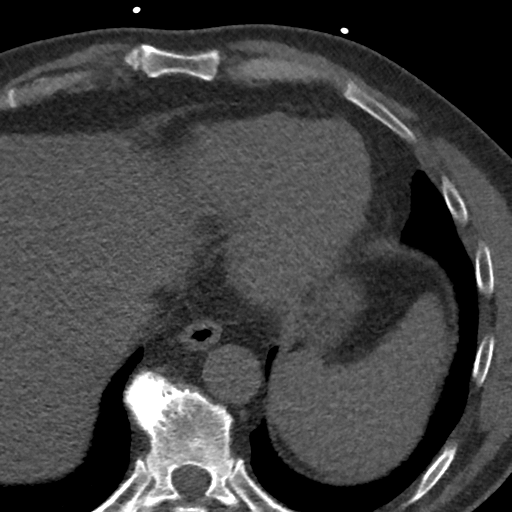
[im 9/43  lung]
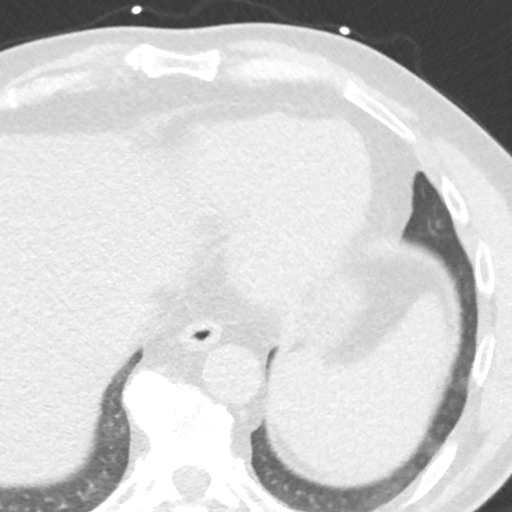
[im 17/43  vessel]
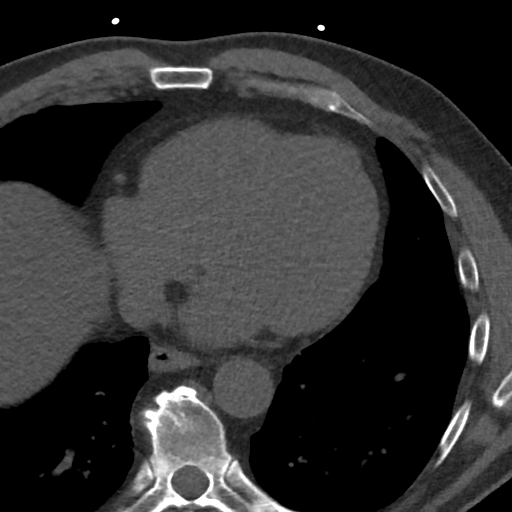
[im 26/43  vessel]
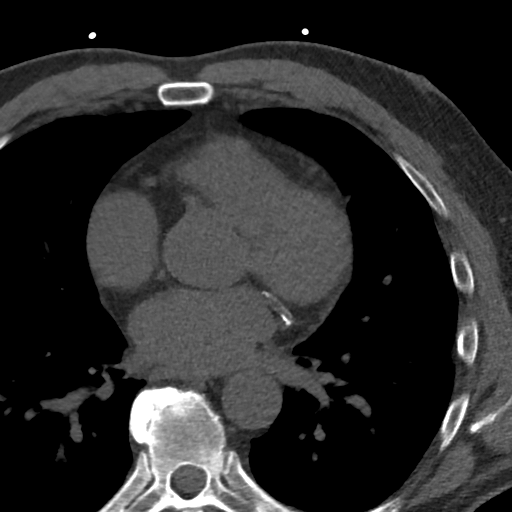
[im 34/43  vessel]
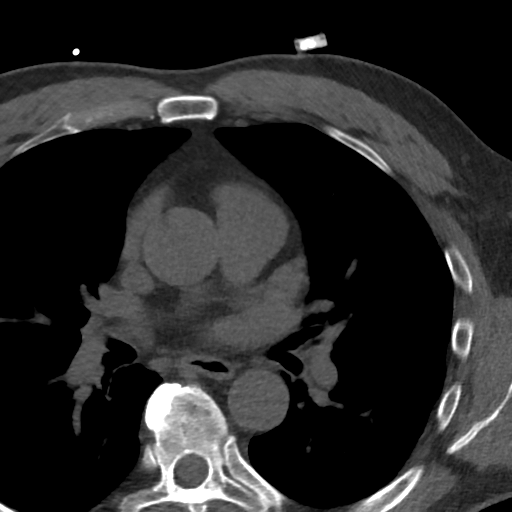

[Series 3: lung 66 % · axial · 0.78mm/px · z∈[-247,-163]mm · 5 of 43 slices shown]
[im 8/43  lung]
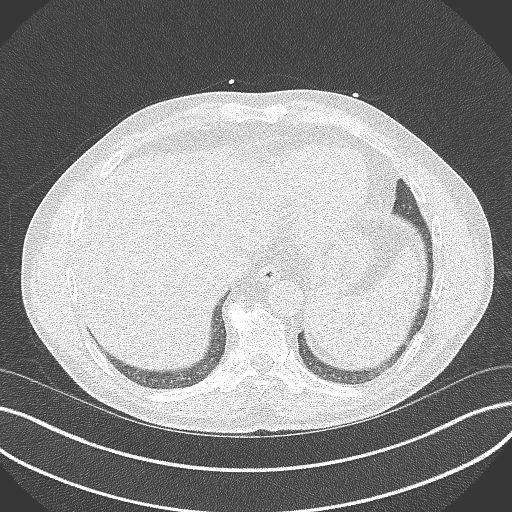
[im 15/43  lung]
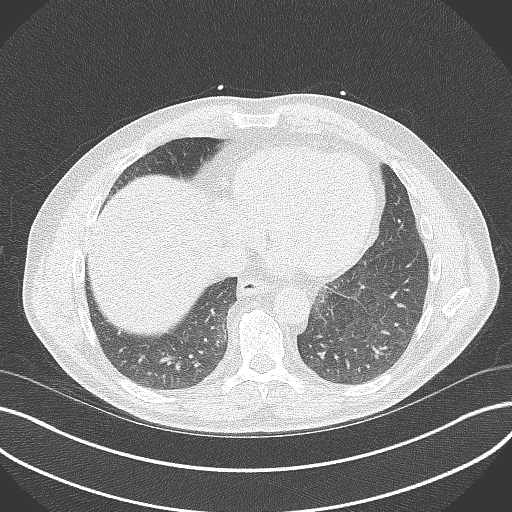
[im 22/43  lung]
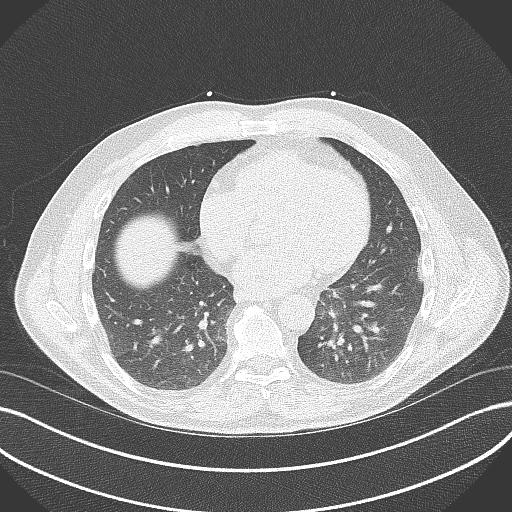
[im 29/43  lung]
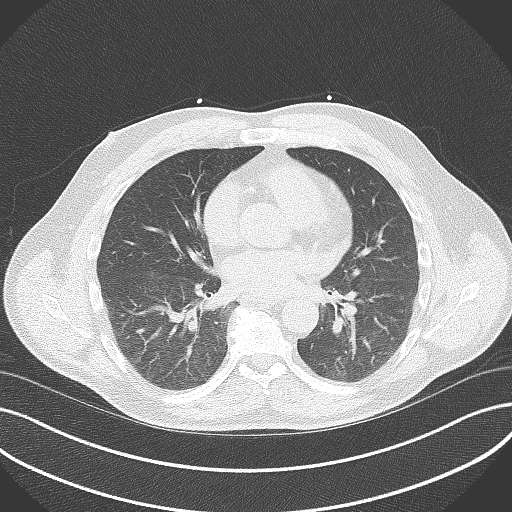
[im 36/43  lung]
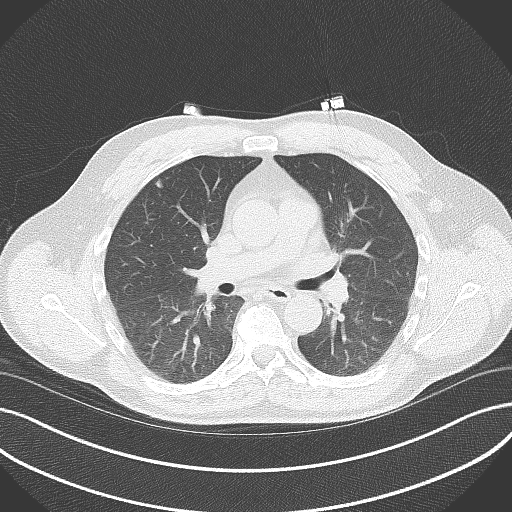

[Series 4: lung st 66 % · axial · 0.78mm/px · z∈[-247,-163]mm · 5 of 43 slices shown]
[im 8/43  lung]
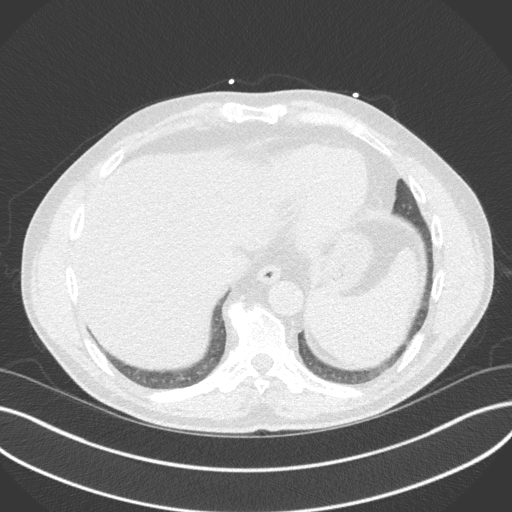
[im 15/43  lung]
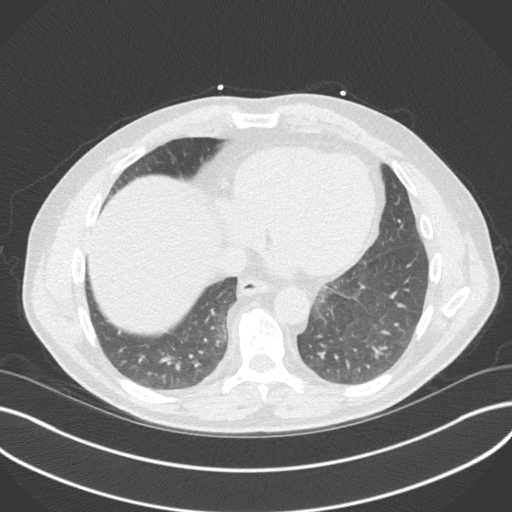
[im 22/43  lung]
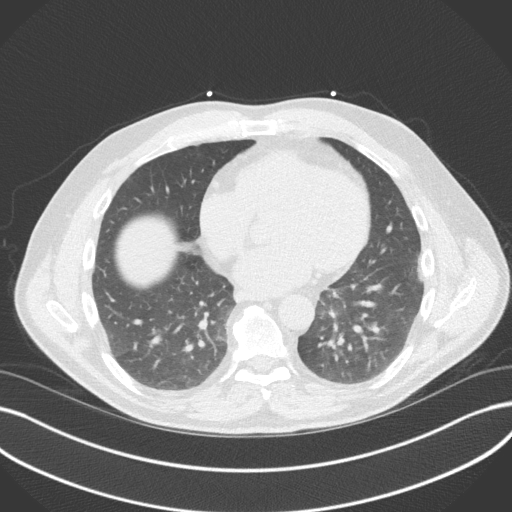
[im 29/43  lung]
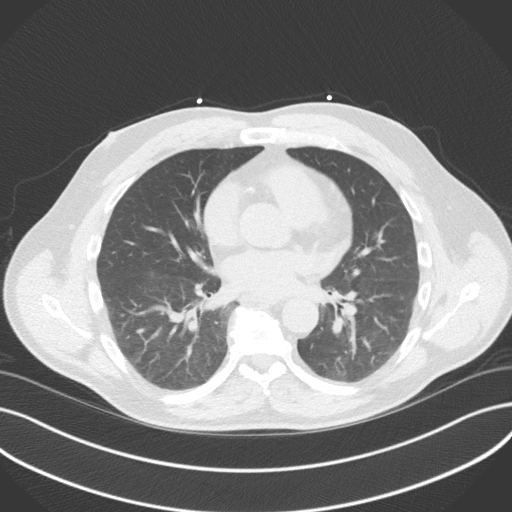
[im 36/43  lung]
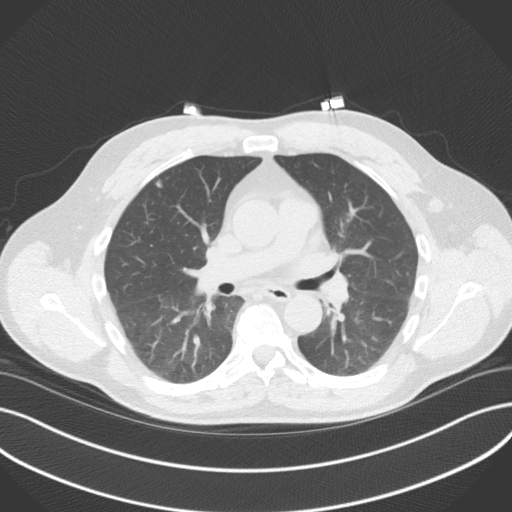

[14 of 20 positions shown; findings below may reference images not displayed]

FINDINGS: Vascular: Aortic atherosclerosis.

Mediastinum/Nodes: No imaged thoracic adenopathy. Multiple
incompletely imaged left axillary nodes at up to 8 mm are likely
reactive.

Lungs/Pleura: No pleural fluid. Right upper lobe pulmonary nodules
including at up to 5 mm on [DATE]. Left lower lobe 4 mm nodule on
[DATE].

Upper Abdomen: Moderate hepatic steatosis. Normal imaged portions of
the spleen, stomach.

Musculoskeletal: Moderate thoracic spondylosis.
IMPRESSION: 1. No acute process in the imaged extracardiac chest.
2. Hepatic steatosis.
3. Aortic Atherosclerosis (EJHHO-R29.9).
4. Bilateral pulmonary nodules of up to 5 mm. No follow-up needed if
patient is low-risk. Non-contrast chest CT can be considered in 12
months if patient is high-risk. This recommendation follows the
consensus statement: Guidelines for Management of Incidental
Pulmonary Nodules Detected on CT Images: From the [REDACTED]AL DATA:  Cardiovascular Disease Risk stratification

EXAM:
Coronary Calcium Score
FINDINGS: Coronary Calcium Score:

Left main: 0

Left anterior descending artery:

Left circumflex artery: 202

Right coronary artery: 45

Total: 260

Percentile: 86th

Pericardium: Normal.

Ascending Aorta: Normal caliber.

Non-cardiac: See separate report from [REDACTED].
IMPRESSION: Coronary calcium score of 260. This was 86th percentile for age-,
race-, and sex-matched controls.



If CAC=0, it is reasonable to withhold statin therapy and reassess
in 5 to 10 years, as long as higher risk conditions are absent
(diabetes mellitus, family history of premature CHD in first degree
relatives (males <55 years; females <65 years), cigarette smoking,
or LDL >=190 mg/dL).

If CAC is 1 to 99, it is reasonable to initiate statin therapy for
patients >=55 years of age.

If CAC is >=100 or >=75th percentile, it is reasonable to initiate
statin therapy at any age.

Cardiology referral should be considered for patients with CAC
scores >=400 or >=75th percentile.

*4698 AHA/ACC/AACVPR/AAPA/ABC/TIGER/SHUBHAM/MATSUMOTO/Gulamsakhi/PENELAS/ESAI/ARIADNA
Guideline on the Management of Blood Cholesterol: A Report of the
American College of Cardiology/American Heart Association Task Force
on Clinical Practice Guidelines. J Am Coll Cardiol.
5099;73(24):9658-9909.

*** End of Addendum ***
EXAM:
OVER-READ INTERPRETATION  CT CHEST

The following report is an over-read performed by radiologist Dr.
Blade Aujla [REDACTED] on 11/01/2020. This over-read
does not include interpretation of cardiac or coronary anatomy or
pathology. The calcium score interpretation by the cardiologist is
attached.
FINDINGS: Vascular: Aortic atherosclerosis.

Mediastinum/Nodes: No imaged thoracic adenopathy. Multiple
incompletely imaged left axillary nodes at up to 8 mm are likely
reactive.

Lungs/Pleura: No pleural fluid. Right upper lobe pulmonary nodules
including at up to 5 mm on [DATE]. Left lower lobe 4 mm nodule on
[DATE].

Upper Abdomen: Moderate hepatic steatosis. Normal imaged portions of
the spleen, stomach.

Musculoskeletal: Moderate thoracic spondylosis.
IMPRESSION: 1. No acute process in the imaged extracardiac chest.
2. Hepatic steatosis.
3. Aortic Atherosclerosis (EJHHO-R29.9).
4. Bilateral pulmonary nodules of up to 5 mm. No follow-up needed if
patient is low-risk. Non-contrast chest CT can be considered in 12
months if patient is high-risk. This recommendation follows the
consensus statement: Guidelines for Management of Incidental
Pulmonary Nodules Detected on CT Images: From the [HOSPITAL]

## 2023-01-21 ENCOUNTER — Other Ambulatory Visit: Payer: Federal, State, Local not specified - PPO

## 2023-01-21 ENCOUNTER — Telehealth: Payer: Self-pay | Admitting: Family Medicine

## 2023-01-21 LAB — HEPATIC FUNCTION PANEL
ALT: 37 U/L (ref 0–53)
AST: 35 U/L (ref 0–37)
Albumin: 4.7 g/dL (ref 3.5–5.2)
Alkaline Phosphatase: 83 U/L (ref 39–117)
Bilirubin, Direct: 0.2 mg/dL (ref 0.0–0.3)
Total Bilirubin: 0.8 mg/dL (ref 0.2–1.2)
Total Protein: 6.9 g/dL (ref 6.0–8.3)

## 2023-01-21 LAB — CBC WITH DIFFERENTIAL/PLATELET
Basophils Absolute: 0.1 10*3/uL (ref 0.0–0.1)
Basophils Relative: 0.9 % (ref 0.0–3.0)
Eosinophils Absolute: 0.4 10*3/uL (ref 0.0–0.7)
Eosinophils Relative: 6.5 % — ABNORMAL HIGH (ref 0.0–5.0)
HCT: 45.2 % (ref 39.0–52.0)
Hemoglobin: 14.7 g/dL (ref 13.0–17.0)
Lymphocytes Relative: 32.6 % (ref 12.0–46.0)
Lymphs Abs: 2 10*3/uL (ref 0.7–4.0)
MCHC: 32.6 g/dL (ref 30.0–36.0)
MCV: 94.6 fL (ref 78.0–100.0)
Monocytes Absolute: 0.6 10*3/uL (ref 0.1–1.0)
Monocytes Relative: 10.7 % (ref 3.0–12.0)
Neutro Abs: 3 10*3/uL (ref 1.4–7.7)
Neutrophils Relative %: 49.3 % (ref 43.0–77.0)
Platelets: 160 10*3/uL (ref 150.0–400.0)
RBC: 4.78 Mil/uL (ref 4.22–5.81)
RDW: 13 % (ref 11.5–15.5)
WBC: 6 10*3/uL (ref 4.0–10.5)

## 2023-01-21 LAB — PROTIME-INR
INR: 1.1 {ratio} — ABNORMAL HIGH (ref 0.8–1.0)
Prothrombin Time: 12 s (ref 9.6–13.1)

## 2023-01-21 NOTE — Telephone Encounter (Signed)
Please see result note 

## 2023-01-21 NOTE — Telephone Encounter (Signed)
Pt returned call for results.

## 2023-01-27 ENCOUNTER — Ambulatory Visit: Payer: Federal, State, Local not specified - PPO | Admitting: Family Medicine

## 2023-11-23 ENCOUNTER — Other Ambulatory Visit: Payer: Self-pay | Admitting: Family Medicine

## 2023-12-23 ENCOUNTER — Other Ambulatory Visit: Payer: Self-pay | Admitting: Family Medicine

## 2024-01-21 ENCOUNTER — Other Ambulatory Visit: Payer: Self-pay | Admitting: Family Medicine

## 2024-01-22 ENCOUNTER — Other Ambulatory Visit: Payer: Self-pay | Admitting: Family Medicine

## 2024-02-24 ENCOUNTER — Other Ambulatory Visit: Payer: Self-pay | Admitting: Family Medicine

## 2024-03-01 ENCOUNTER — Ambulatory Visit: Admitting: Family Medicine

## 2024-03-01 ENCOUNTER — Encounter: Payer: Self-pay | Admitting: Family Medicine

## 2024-03-01 VITALS — BP 148/90 | HR 84 | Temp 98.2°F | Wt 193.4 lb

## 2024-03-01 DIAGNOSIS — I10 Essential (primary) hypertension: Secondary | ICD-10-CM | POA: Insufficient documentation

## 2024-03-01 DIAGNOSIS — R0789 Other chest pain: Secondary | ICD-10-CM

## 2024-03-01 MED ORDER — LOSARTAN POTASSIUM-HCTZ 100-12.5 MG PO TABS: 1.0000 | ORAL_TABLET | Freq: Every day | ORAL | 3 refills | Status: AC

## 2024-03-01 NOTE — Patient Instructions (Signed)
 Monitor blood pressure and be in touch if consistently > 140/90.

## 2024-03-01 NOTE — Progress Notes (Signed)
 Established Patient Office Visit  Subjective   Patient ID: Walter [name], male    DOB: 11-11-1960  Age: 63 y.o. MRN: [id number]  Chief Complaint  Patient presents with   Abdominal Pain    HPI   Walter [name] is seen with approximately 2 to 3-week history of right sided chest wall pain.  Denies any specific injury.  Recently did start back doing some push-ups and wonders if this may have affected the region.  He has dull pain which comes and goes.  No night pain.  No pain at rest and tends to be noted when he is up moving around more.  Denies any pleuritic pain or dyspnea.  No cough.  No appetite or weight changes.  No rash.  No anterior abdominal pain.  Has not taken any over-the-counter analgesics.  He has hypertension currently on losartan  100 mg daily.  Compliant with therapy.  Still drinking alcohol but has scaled back.  No recent headaches or dizziness.  Has gained some weight.  Past Medical History:  Diagnosis Date   Basal cell carcinoma (BCC) of face 2014   LEFT eye-   GERD (gastroesophageal reflux disease)    with certain foods   Gout    Past Surgical History:  Procedure Laterality Date   COLONOSCOPY  2015   DJ-movi(exc)-tics/TA's   POLYPECTOMY  2015   TA's   UPPER GASTROINTESTINAL ENDOSCOPY  2015   h.pylori/gastritis   WISDOM TOOTH EXTRACTION      reports that he has never smoked. He has never used smokeless tobacco. He reports current alcohol use of about 14.0 standard drinks of alcohol per week. He reports that he does not use drugs. family history includes Alcohol abuse in an other family member; Arthritis in his mother and sister; Diabetes in his father; Heart disease (age of onset: 6) in his father; Hyperlipidemia in his father; Hypertension in his father. Allergies  Allergen Reactions   Amoxicillin  Rash   Poison Ivy Extract Rash    Review of Systems  Constitutional:  Negative for chills and fever.  Respiratory:  Negative for cough, hemoptysis and shortness of  breath.   Cardiovascular:  Negative for chest pain.  Gastrointestinal:  Negative for abdominal pain, blood in stool, diarrhea, nausea and vomiting.  Skin:  Negative for rash.  Neurological:  Negative for dizziness.      Objective:     BP (!) 148/90 (BP Location: Left Arm, Cuff Size: Normal)   Pulse 84   Temp 98.2 F (36.8 C) (Oral)   Wt 193 lb 6.4 oz (87.7 kg)   SpO2 98%   BMI 26.60 kg/m  BP Readings from Last 3 Encounters:  03/01/24 (!) 148/90  11/15/22 (!) 130/90  06/26/21 (!) 190/96   Wt Readings from Last 3 Encounters:  03/01/24 193 lb 6.4 oz (87.7 kg)  11/15/22 191 lb (86.6 kg)  06/26/21 196 lb 12.8 oz (89.3 kg)      Physical Exam Vitals reviewed.  Constitutional:      General: He is not in acute distress.    Appearance: He is not ill-appearing.  Cardiovascular:     Rate and Rhythm: Normal rate and regular rhythm.  Pulmonary:     Effort: Pulmonary effort is normal. No respiratory distress.     Breath sounds: Normal breath sounds. No wheezing or rales.  Abdominal:     General: Bowel sounds are normal.     Palpations: Abdomen is soft. There is no hepatomegaly or mass.  Tenderness: There is no abdominal tenderness.  Neurological:     Mental Status: He is alert.      No results found for any visits on 03/01/24.    The 10-year ASCVD risk score (Arnett DK, et al., 2019) is: 11.2%    Assessment & Plan:   #1 right chest wall pain.  Nonfocal exam.  Suspect musculoskeletal.  Doubt pulmonary.  Doubt abdominal origin.  Recommend over-the-counter analgesics as needed.  Follow-up for any persistent or worsening symptoms or new symptoms.  #2 hypertension-poorly controlled.  Work on weight loss.  Discussed changing losartan  to losartan /HCTZ 100/12.5 mg once daily.  Set up follow-up in 2 to 3 months to reassess.   Return in about 3 months (around 06/01/2024).    Walter Scarlet, MD

## 2024-04-07 ENCOUNTER — Telehealth: Payer: Self-pay

## 2024-04-07 NOTE — Progress Notes (Signed)
" ° °  04/07/2024  Patient ID: Walter [name], male   DOB: July 01, 1960, 63 y.o.   MRN: [id number]  Pharmacy Quality Measure Review  This patient is appearing on a report for being at risk of failing the Controlling Blood Pressure measure this calendar year.   Last documented BP 148/90 on 03/01/24  Spoke with patient via telephone. Confirms he has been taking the losartan -hydrochlorothiazide daily with no missed doses. Denies any symptoms of high/low BP. Has not been able to monitor BP at home with busy holiday season but he reports he will start doing so now.  Requests a Feb f/u with PCP, scheduled.  Jon VEAR Lindau, PharmD Clinical Pharmacist [phone]    "

## 2024-04-15 ENCOUNTER — Other Ambulatory Visit: Payer: Self-pay | Admitting: Family Medicine

## 2024-04-15 NOTE — Telephone Encounter (Signed)
 Copied from CRM #[id number]. Topic: Clinical - Medication Refill >> Apr 15, 2024 12:26 PM Alfonso ORN wrote: Medication:  allopurinol  (ZYLOPRIM ) 300 MG tablet   Has the patient contacted their pharmacy? Yes   This is the patient's preferred pharmacy:  [id number] - [place], [place] - [address] [place] [address] Phone: [phone] Fax: [phone]  Is this the correct pharmacy for this prescription? Yes   Has the prescription been filled recently? no  Is the patient out of the medication? Yes  Has the patient been seen for an appointment in the last year OR does the patient have an upcoming appointment? Yes  Can we respond through MyChart? Yes

## 2024-04-16 ENCOUNTER — Other Ambulatory Visit: Payer: Self-pay | Admitting: Family Medicine

## 2024-04-16 MED ORDER — ALLOPURINOL 300 MG PO TABS: ORAL_TABLET | ORAL | 0 refills | Status: AC

## 2024-05-18 ENCOUNTER — Ambulatory Visit: Admitting: Family Medicine
